# Patient Record
Sex: Female | Born: 1935 | Race: Black or African American | Hispanic: No | Marital: Single | Smoking: Never smoker
Health system: Southern US, Community
[De-identification: ages and names within clinical notes are randomized; demographics above are authoritative.]

## PROBLEM LIST (undated history)

## (undated) DIAGNOSIS — L8990 Pressure ulcer of unspecified site, unspecified stage: Secondary | ICD-10-CM

## (undated) DIAGNOSIS — F329 Major depressive disorder, single episode, unspecified: Secondary | ICD-10-CM

## (undated) DIAGNOSIS — C801 Malignant (primary) neoplasm, unspecified: Secondary | ICD-10-CM

## (undated) DIAGNOSIS — K219 Gastro-esophageal reflux disease without esophagitis: Secondary | ICD-10-CM

## (undated) DIAGNOSIS — R5383 Other fatigue: Secondary | ICD-10-CM

## (undated) DIAGNOSIS — M2450 Contracture, unspecified joint: Secondary | ICD-10-CM

## (undated) DIAGNOSIS — M1990 Unspecified osteoarthritis, unspecified site: Secondary | ICD-10-CM

## (undated) DIAGNOSIS — G4700 Insomnia, unspecified: Secondary | ICD-10-CM

## (undated) DIAGNOSIS — I2510 Atherosclerotic heart disease of native coronary artery without angina pectoris: Secondary | ICD-10-CM

## (undated) DIAGNOSIS — G20A1 Parkinson's disease without dyskinesia, without mention of fluctuations: Secondary | ICD-10-CM

## (undated) DIAGNOSIS — I10 Essential (primary) hypertension: Secondary | ICD-10-CM

## (undated) DIAGNOSIS — I2699 Other pulmonary embolism without acute cor pulmonale: Secondary | ICD-10-CM

## (undated) DIAGNOSIS — G20 Parkinson's disease: Secondary | ICD-10-CM

## (undated) DIAGNOSIS — R531 Weakness: Secondary | ICD-10-CM

## (undated) DIAGNOSIS — Z931 Gastrostomy status: Secondary | ICD-10-CM

## (undated) DIAGNOSIS — R630 Anorexia: Secondary | ICD-10-CM

## (undated) DIAGNOSIS — R5381 Other malaise: Secondary | ICD-10-CM

## (undated) DIAGNOSIS — F32A Depression, unspecified: Secondary | ICD-10-CM

## (undated) HISTORY — DX: Atherosclerotic heart disease of native coronary artery without angina pectoris: I25.10

## (undated) HISTORY — PX: CHOLECYSTECTOMY: SHX55

## (undated) HISTORY — PX: CYSTECTOMY: SUR359

## (undated) HISTORY — PX: HERNIA REPAIR: SHX51

## (undated) HISTORY — PX: HEMICOLECTOMY: SHX854

## (undated) HISTORY — PX: ABDOMINAL HYSTERECTOMY: SHX81

## (undated) HISTORY — PX: GASTROSTOMY TUBE PLACEMENT: SHX655

## (undated) HISTORY — PX: MASTECTOMY: SHX3

---

## 1998-12-13 ENCOUNTER — Inpatient Hospital Stay (HOSPITAL_COMMUNITY): Admission: EM | Admit: 1998-12-13 | Discharge: 1998-12-18 | Payer: Self-pay | Admitting: Emergency Medicine

## 1998-12-18 ENCOUNTER — Encounter: Payer: Self-pay | Admitting: Cardiology

## 2002-03-30 ENCOUNTER — Ambulatory Visit (HOSPITAL_COMMUNITY): Admission: RE | Admit: 2002-03-30 | Discharge: 2002-03-30 | Payer: Self-pay | Admitting: Internal Medicine

## 2002-04-10 ENCOUNTER — Emergency Department (HOSPITAL_COMMUNITY): Admission: EM | Admit: 2002-04-10 | Discharge: 2002-04-10 | Payer: Self-pay | Admitting: Emergency Medicine

## 2002-06-14 ENCOUNTER — Ambulatory Visit (HOSPITAL_COMMUNITY): Admission: RE | Admit: 2002-06-14 | Discharge: 2002-06-14 | Payer: Self-pay | Admitting: Internal Medicine

## 2002-06-15 ENCOUNTER — Inpatient Hospital Stay (HOSPITAL_COMMUNITY): Admission: AD | Admit: 2002-06-15 | Discharge: 2002-06-16 | Payer: Self-pay | Admitting: Internal Medicine

## 2004-01-14 ENCOUNTER — Emergency Department (HOSPITAL_COMMUNITY): Admission: EM | Admit: 2004-01-14 | Discharge: 2004-01-14 | Payer: Self-pay | Admitting: Emergency Medicine

## 2004-06-15 ENCOUNTER — Emergency Department (HOSPITAL_COMMUNITY): Admission: EM | Admit: 2004-06-15 | Discharge: 2004-06-16 | Payer: Self-pay | Admitting: Emergency Medicine

## 2004-06-15 ENCOUNTER — Ambulatory Visit: Payer: Self-pay | Admitting: Internal Medicine

## 2004-06-27 ENCOUNTER — Ambulatory Visit: Payer: Self-pay | Admitting: Internal Medicine

## 2004-06-28 ENCOUNTER — Ambulatory Visit: Payer: Self-pay

## 2004-07-11 ENCOUNTER — Ambulatory Visit: Payer: Self-pay | Admitting: Internal Medicine

## 2004-08-26 ENCOUNTER — Ambulatory Visit: Payer: Self-pay | Admitting: Internal Medicine

## 2004-10-03 ENCOUNTER — Ambulatory Visit: Payer: Self-pay | Admitting: Internal Medicine

## 2004-11-13 ENCOUNTER — Ambulatory Visit: Payer: Self-pay | Admitting: Internal Medicine

## 2004-11-19 ENCOUNTER — Ambulatory Visit: Payer: Self-pay | Admitting: Cardiology

## 2005-01-06 ENCOUNTER — Ambulatory Visit: Payer: Self-pay | Admitting: Internal Medicine

## 2005-02-17 ENCOUNTER — Ambulatory Visit: Payer: Self-pay | Admitting: Internal Medicine

## 2005-03-19 ENCOUNTER — Ambulatory Visit: Payer: Self-pay | Admitting: Internal Medicine

## 2005-04-21 ENCOUNTER — Ambulatory Visit: Payer: Self-pay | Admitting: Cardiology

## 2005-04-23 ENCOUNTER — Ambulatory Visit: Payer: Self-pay | Admitting: Internal Medicine

## 2005-05-22 ENCOUNTER — Ambulatory Visit: Payer: Self-pay | Admitting: Internal Medicine

## 2005-06-13 ENCOUNTER — Ambulatory Visit: Payer: Self-pay | Admitting: *Deleted

## 2005-07-15 ENCOUNTER — Ambulatory Visit: Payer: Self-pay | Admitting: Internal Medicine

## 2005-08-19 ENCOUNTER — Ambulatory Visit: Payer: Self-pay | Admitting: Internal Medicine

## 2005-09-19 ENCOUNTER — Ambulatory Visit: Payer: Self-pay | Admitting: Internal Medicine

## 2005-10-09 ENCOUNTER — Ambulatory Visit: Payer: Self-pay | Admitting: Cardiology

## 2005-11-17 ENCOUNTER — Ambulatory Visit: Payer: Self-pay | Admitting: Internal Medicine

## 2006-01-08 ENCOUNTER — Ambulatory Visit: Payer: Self-pay | Admitting: Internal Medicine

## 2006-02-12 ENCOUNTER — Ambulatory Visit: Payer: Self-pay | Admitting: Cardiology

## 2006-03-26 ENCOUNTER — Ambulatory Visit: Payer: Self-pay | Admitting: Internal Medicine

## 2006-04-24 ENCOUNTER — Ambulatory Visit: Payer: Self-pay | Admitting: Internal Medicine

## 2006-05-22 ENCOUNTER — Ambulatory Visit: Payer: Self-pay | Admitting: Internal Medicine

## 2006-06-21 ENCOUNTER — Ambulatory Visit: Payer: Self-pay | Admitting: Internal Medicine

## 2006-07-06 ENCOUNTER — Ambulatory Visit: Payer: Self-pay | Admitting: Cardiology

## 2006-07-15 ENCOUNTER — Ambulatory Visit: Payer: Self-pay | Admitting: Internal Medicine

## 2006-07-15 ENCOUNTER — Ambulatory Visit (HOSPITAL_COMMUNITY): Admission: RE | Admit: 2006-07-15 | Discharge: 2006-07-15 | Payer: Self-pay | Admitting: Internal Medicine

## 2006-08-03 ENCOUNTER — Ambulatory Visit: Payer: Self-pay

## 2006-08-17 ENCOUNTER — Ambulatory Visit: Payer: Self-pay

## 2006-08-17 ENCOUNTER — Encounter: Payer: Self-pay | Admitting: Cardiology

## 2006-08-17 ENCOUNTER — Ambulatory Visit: Payer: Self-pay | Admitting: Internal Medicine

## 2006-09-11 ENCOUNTER — Ambulatory Visit: Payer: Self-pay | Admitting: Internal Medicine

## 2006-10-09 ENCOUNTER — Ambulatory Visit: Payer: Self-pay | Admitting: Internal Medicine

## 2006-11-06 ENCOUNTER — Ambulatory Visit: Payer: Self-pay | Admitting: Internal Medicine

## 2006-12-11 ENCOUNTER — Ambulatory Visit: Payer: Self-pay | Admitting: Internal Medicine

## 2007-01-01 ENCOUNTER — Ambulatory Visit: Payer: Self-pay | Admitting: Internal Medicine

## 2007-01-29 ENCOUNTER — Ambulatory Visit: Payer: Self-pay | Admitting: Internal Medicine

## 2007-02-26 ENCOUNTER — Ambulatory Visit: Payer: Self-pay | Admitting: Internal Medicine

## 2007-03-29 ENCOUNTER — Ambulatory Visit: Payer: Self-pay | Admitting: Internal Medicine

## 2007-04-23 ENCOUNTER — Ambulatory Visit: Payer: Self-pay | Admitting: Internal Medicine

## 2007-05-04 ENCOUNTER — Ambulatory Visit: Payer: Self-pay | Admitting: Internal Medicine

## 2007-05-21 ENCOUNTER — Ambulatory Visit: Payer: Self-pay | Admitting: Internal Medicine

## 2007-06-18 ENCOUNTER — Ambulatory Visit: Payer: Self-pay | Admitting: Internal Medicine

## 2007-07-20 ENCOUNTER — Ambulatory Visit: Payer: Self-pay | Admitting: Internal Medicine

## 2007-11-12 ENCOUNTER — Ambulatory Visit: Payer: Self-pay | Admitting: Internal Medicine

## 2007-12-29 ENCOUNTER — Ambulatory Visit: Payer: Self-pay

## 2008-02-11 ENCOUNTER — Ambulatory Visit: Payer: Self-pay | Admitting: Internal Medicine

## 2008-05-12 ENCOUNTER — Ambulatory Visit: Payer: Self-pay | Admitting: Internal Medicine

## 2008-05-25 ENCOUNTER — Ambulatory Visit: Payer: Self-pay | Admitting: Cardiology

## 2008-05-25 ENCOUNTER — Ambulatory Visit (HOSPITAL_COMMUNITY): Admission: RE | Admit: 2008-05-25 | Discharge: 2008-05-25 | Payer: Self-pay | Admitting: Internal Medicine

## 2008-05-25 ENCOUNTER — Encounter: Payer: Self-pay | Admitting: Internal Medicine

## 2008-08-11 ENCOUNTER — Ambulatory Visit: Payer: Self-pay | Admitting: Internal Medicine

## 2008-08-16 ENCOUNTER — Ambulatory Visit: Payer: Self-pay | Admitting: Internal Medicine

## 2008-08-17 ENCOUNTER — Encounter: Payer: Self-pay | Admitting: Internal Medicine

## 2009-02-05 DIAGNOSIS — R079 Chest pain, unspecified: Secondary | ICD-10-CM

## 2009-02-05 DIAGNOSIS — I2510 Atherosclerotic heart disease of native coronary artery without angina pectoris: Secondary | ICD-10-CM

## 2009-05-14 ENCOUNTER — Ambulatory Visit: Payer: Self-pay | Admitting: Internal Medicine

## 2009-05-17 ENCOUNTER — Encounter (INDEPENDENT_AMBULATORY_CARE_PROVIDER_SITE_OTHER): Payer: Self-pay | Admitting: *Deleted

## 2009-10-31 ENCOUNTER — Ambulatory Visit: Payer: Self-pay | Admitting: Internal Medicine

## 2009-11-28 ENCOUNTER — Encounter (INDEPENDENT_AMBULATORY_CARE_PROVIDER_SITE_OTHER): Payer: Self-pay | Admitting: *Deleted

## 2009-12-19 ENCOUNTER — Encounter: Payer: Self-pay | Admitting: Internal Medicine

## 2009-12-19 ENCOUNTER — Ambulatory Visit: Payer: Self-pay | Admitting: Cardiology

## 2010-06-28 ENCOUNTER — Encounter (INDEPENDENT_AMBULATORY_CARE_PROVIDER_SITE_OTHER): Payer: Self-pay | Admitting: *Deleted

## 2010-08-07 ENCOUNTER — Encounter (INDEPENDENT_AMBULATORY_CARE_PROVIDER_SITE_OTHER): Payer: Self-pay | Admitting: *Deleted

## 2010-08-16 ENCOUNTER — Encounter: Payer: Self-pay | Admitting: Internal Medicine

## 2010-08-20 NOTE — Letter (Signed)
Summary: Appointment - Reminder 2  [facility]. [address]   [place], [place] [address]   Phone: [phone]  Fax: [phone]     June 28, 2010 MRN: [id number]   Sandy Schmitt [address] [place], [place]  [address]   Dear [name],  Our records indicate that it is time to schedule a follow-up appointment.  Dr.  Ladona Ridgel        recommended that you follow up with [place] in    12.2011        . It is very important that we reach you to schedule this appointment. We look forward to participating in your health care needs. Please contact us at the number listed above at your earliest convenience to schedule your appointment.  If you are unable to make an appointment at this time, give [place] a call so we can update our records.     Sincerely,   Glass blower/designer

## 2010-08-20 NOTE — Letter (Signed)
Summary: Appointment - Reminder 2  [facility] [place]. [address] [place] [address]   Phone: [phone]  Fax: [phone]     Nov 28, 2009 MRN: [id number]   Sandy Schmitt [address] [place], [place]  [address]   Dear [name],  Our records indicate that it is time to schedule a follow-up appointment.  Dr.   Ladona Ridgel       recommended that you follow up with [place] in     JANUARY 2011       . It is very important that we reach you to schedule this appointment. We look forward to participating in your health care needs. Please contact us at the number listed above at your earliest convenience to schedule your appointment.  If you are unable to make an appointment at this time, give [place] a call so we can update our records.     Sincerely,   [employer] Scheduling Team [phone]

## 2010-08-20 NOTE — Procedures (Signed)
Summary: PAST DUE FOR PACER CHECK PER PT PHONE CALL/TG   Current Medications (verified): 1)  Effexor Xr 150 Mg Xr24h-Cap (Venlafaxine Hcl) .... One By Mouth Daily 2)  Allegra 180 Mg Tabs (Fexofenadine Hcl) .... One By Mouth Daily 3)  Lipitor 10 Mg Tabs (Atorvastatin Calcium) .... One By Mouth Daily 4)  Lasix 40 Mg Tabs (Furosemide) .... One By Mouth Daily 5)  Aspir-Low 81 Mg Tbec (Aspirin) .... One By Mouth Daily 6)  Klor-Con 10 10 Meq Cr-Tabs (Potassium Chloride) .... One By Mouth Daily 7)  Cyproheptadine Hcl 4 Mg Tabs (Cyproheptadine Hcl) .... One By Mouth Daily 8)  Metoprolol Tartrate 25 Mg Tabs (Metoprolol Tartrate) .... One By Mouth Daily 9)  Omeprazole 20 Mg Cpdr (Omeprazole) .... One By Mouth Daily 10)  Calcium 600 Mg Tabs (Calcium) .... One By Mouth Daily 11)  Daily Multi  Tabs (Multiple Vitamins-Minerals) .... One By Mouth Daily 12)  Vita-Plus E 400 Unit Caps (Vitamin E) .... One By Mouth Daily 13)  Vitamin D3 2000 Unit Caps (Cholecalciferol) .... One By Mouth Daily 14)  Vitamin C 500 Mg Tabs (Ascorbic Acid) .... One By Mouth Daily  Allergies (verified): 1)  ! Penicillin 2)  ! Jonne Ply   PPM Specifications Following MD:  Lewayne Bunting, MD     PPM Vendor:  [facility] [employer]     PPM Model Number:  [id number]     PPM Serial Number:  [id number] PPM DOI:  07/15/2006     PPM Implanting MD:  Lewayne Bunting, MD  Lead 1    Location: RA     DOI: 12/17/1998     Model #: [id number]     Serial #: [id number]     Status: active Lead 2    Location: RV     DOI: 12/17/1998     Model #: [id number]     Serial #: [id number]     Status: active   Indications:  SICK SINUS SYNDROME   PPM Follow Up Remote Check?  No Battery Voltage:  good V     Battery Est. Longevity:  >5 years     Pacer Dependent:  No       PPM Device Measurements Atrium  Amplitude: 2.6 mV, Impedance: 320 ohms, Threshold: 0.3 V at 0.4 msec Right Ventricle  Amplitude: 9.0 mV, Impedance: 440 ohms, Threshold: 0.8 V at 0.4 msec  Episodes MS Episodes:  0      Percent Mode Switch:  0     Coumadin:  No Ventricular High Rate:  4     Atrial Pacing:  42%     Ventricular Pacing:  91%  Parameters Mode:  DDD     Lower Rate Limit:  60     Upper Rate Limit:  115 Paced AV Delay:  300     Sensed AV Delay:  210 Next Cardiology Appt Due:  06/20/2010 Tech Comments:  No parameter changes.  Device function normal.  4 VHR episodes probable VT, the patient was asymptomatic.  TTM's with [employer].  ROV 6 months in RDS with Dr. Ladona Ridgel. Altha Harm, LPN  December 19, 6293 9:02 AM

## 2010-08-20 NOTE — Cardiovascular Report (Signed)
Summary: TTM   TTM   Imported By: Roderic Ovens 12/13/2009 11:45:50  _____________________________________________________________________  External Attachment:    Type:   Image     Comment:   External Document

## 2010-08-20 NOTE — Cardiovascular Report (Signed)
Summary: Office Visit   Office Visit   Imported By: Roderic Ovens 01/12/2010 12:06:28  _____________________________________________________________________  External Attachment:    Type:   Image     Comment:   External Document

## 2010-08-22 NOTE — Letter (Signed)
Summary: Device-Delinquent Check  [facility], Main Office  [address]   [place], [place] [address]   Phone: [phone]  Fax: [phone]     August 07, 2010 MRN: [id number]   Sandy Schmitt [address] [place], [place]  [address]   Dear Ms. Mccauslin,  According to our records, you have not had your implanted device checked in the recommended period of time.  We are unable to determine appropriate device function without checking your device on a regular basis.  Please call our office to schedule an appointment, with Dr Ladona Ridgel,  as soon as possible.  If you are having your device checked by another physician, please call us so that we may update our records.  Thank you,  Letta Moynahan, EMT  August 07, 2010 11:45 AM  [facility]  certified

## 2010-09-11 NOTE — Cardiovascular Report (Signed)
Summary: Certified Letter Signed - (not doing f/u)  Certified Letter Signed - (not doing f/u)   Imported By: Debby Freiberg 09/02/2010 11:55:51  _____________________________________________________________________  External Attachment:    Type:   Image     Comment:   External Document

## 2010-12-03 NOTE — Assessment & Plan Note (Signed)
 HEALTHCARE                         ELECTROPHYSIOLOGY OFFICE NOTE   SINCERITY, CEDAR                       MRN:          [id number]  DATE:05/04/2007                            DOB:          24-Jun-1917    Ms. Storlie returns today for followup.  She is a very pleasant 77-year-  old woman with a history of intermittent complete heart block as well as  ventricular tachycardia who is status post ICD insertion initially back  in 2000 with generator change in December of last year.  She returns  today for followup.  She denies syncope.  She denies much in the way of  palpitations or syncope.  She has been otherwise stable.  She does note  that her appetite has gone down some in the last few weeks.  She denies  peripheral edema, she denies difficulty with sleep, she denies problems  with her bowels.   MEDICATIONS:  1. Lipitor 10 mg a day.  2. Toprol-XL 25 a day.  3. Enteric coated aspirin 81 a day.  4. Furosemide 80 mg 1/2 tablet daily.  5. Potassium supplement.  6. Ranitidine 150 mg daily.   PHYSICAL EXAM:  Notable for her being a pleasant but elderly appearing  woman in no acute distress.  She is able to answer all questions very  well.  Her blood pressure was 117/75, the pulse was 78 and regular, the  respirations were 16, the weight was not recorded secondary to her being  in a wheelchair.  NECK:  No jugular venous distention.  LUNGS:  Clear bilaterally to auscultation.  No wheezes, rales or rhonchi  were present.  CARDIOVASCULAR EXAM:  A regular rate and rhythm with a normal S1 S2.  EXTREMITIES:  No edema.   Interrogation of her pacemaker demonstrates a Guidant Insignia with P  and R waves of 2 and 10 respectively, the __________ impedance was 340  in the atrium, 420 in the ventricle, 0.6 at 0.4 was the atrial threshold  and 0.8 at 0.4 was the RV threshold.  There were no mode switching  episodes.  She did have 39 high rate episodes in the  ventricle, this was  nonsustained VT.   IMPRESSION:  1. Symptomatic bradycardia status post pacemaker insertion.  2. Nonsustained ventricular tachycardia.  3. Obesity.   DISCUSSION:  Overall, Ms. Milius is stable, her pacemaker is working  normally, her heart failure is well controlled.  She will see [place] back in  the office in 1 year, with me in 6 months in our device clinic.     Doylene Canning. Ladona Ridgel, MD  Electronically Signed    GWT/MedQ  DD: 05/04/2007  DT: 05/04/2007  Job #: [id number]   cc:   Doreen Beam

## 2010-12-03 NOTE — Assessment & Plan Note (Signed)
[facility]                         ELECTROPHYSIOLOGY OFFICE NOTE   CHANNELL, QUATTRONE                       MRN:          [id number]  DATE:08/16/2008                            DOB:          12/26/1916    Sandy Schmitt returns today for followup.  She is a very pleasant elderly  woman with a history of symptomatic bradycardia and hypertension, who  underwent permanent pacemaker insertion back in 2007.  She returns today  for followup.  She denies chest pain or shortness of breath.  She does  have some peripheral edema, but she admits to some sodium indiscretion.   MEDICATIONS:  1. Lipitor 10 a day.  2. Toprol-XL 25 a day.  3. Multivitamin.  4. Aspirin 81 a day.  5. Furosemide 80 mg half-tablet daily.  6. Effexor 150 a day.  7. Potassium 20 two tablets daily.  8. Ranitidine 150 daily.   PHYSICAL EXAMINATION:  GENERAL:  She is a pleasant, elderly-appearing  woman in no distress.  VITAL SIGNS:  Blood pressure was 132/83, pulse was 69 and regular,  respirations were 18.  The weight was not recorded secondary to her  being in a wheelchair.  NECK:  No jugular venous distention.  LUNGS:  Clear bilaterally to auscultation.  No wheezes, rales, or  rhonchi are present.  There is no increased work of breathing.  CARDIOVASCULAR:  Regular rate and rhythm.  Normal S1 and S2.  EXTREMITIES:  Trace peripheral edema bilaterally.   Interrogation of her pacemaker demonstrates a [employer].  The P  and R-waves were 2 and 9 respectively.  The impedance was 300 in the A  and 410 in the V.  The threshold was 0.8 at 0.4 in the A and the RV.  The battery voltage was still at beginning of the life.  Estimated  longevity was greater than 5 years.  She was 99% V-paced and 1% A-paced.   IMPRESSION:  1. Symptomatic bradycardia.  2. Hypertension.  3. Status post pacemaker insertion.   DISCUSSION:  Ms. Langenberg is stable.  Her pacemaker is working normally.  Her  blood pressure is well controlled.  We talked about the importance  of maintaining on a low-sodium diet.  I will plan to see the patient  back in the office for followup in 1 year.     Doylene Canning. Ladona Ridgel, MD  Electronically Signed    GWT/MedQ  DD: 08/16/2008  DT: 08/17/2008  Job #: [id number]

## 2010-12-06 NOTE — H&P (Signed)
NAME:  Sandy Schmitt, Sandy Schmitt                          ACCOUNT NO.:  [id number]   MEDICAL RECORD NO.:  [id number]                   PATIENT TYPE:  INP   LOCATION:  A330                                 FACILITY:  [facility]   PHYSICIAN:  Lionel December, M.D.                 DATE OF BIRTH:  07-21-1917   DATE OF ADMISSION:  06/15/2002  DATE OF DISCHARGE:                                HISTORY & PHYSICAL   CHIEF COMPLAINT:  Inability to keep liquids, solids or pills down.   HISTORY OF PRESENT ILLNESS:  The patient is an 77 year old, African-American  female with multiple medical problems who had esophagogastroduodenoscopy  with esophageal dilatation by me yesterday.  She presented with frequent  dysphagia and she also has history of abdominal pain, nausea and vomiting.  She was noted to have a sliding hiatal hernia and gastritis.  Her esophagus  was dilated by passing a 56 [place] dilator.  Postprocedure, she was served  lunch and she had no difficulty swallowing soft foods.  The patient's  caretaker called this morning stating that she was not able to keep liquids,  food or pills down.  I talked with the daughter and suggested giving her  liquids.  This afternoon she called in stating that her mother was still  unable to keep any food down.  Given her multiple medical problems and the  fact that she needs to be on medication, it was decided to bring her to the  hospital for IV fluids and further management.  She denies chest pain,  odynophagia, hoarseness, chronic cough or dyspnea.  She also denies  abdominal pain, melena or rectal bleeding.  She did have a BM yesterday.  She has chronic constipation and has been on therapy.  She denies headache,  fever or chills.  She states that she did not have any problems swallowing  yesterday.  She has a history of Schatzki's ring.  She had her esophagus  dilated initially on March 30, 2002, and had significant improvement  post dilatation, but this lasted  only for eight to nine weeks.  At that  time, her H. pylori serology was positive and she was treated with a one-  week course of Biaxin, Flagyl and Aciphex.  She also had intermittent  abdominal pain.  She had a small bowel series recently at [facility] which was  within normal limits.  She denies weakness.   MEDICATIONS:  1. MiraLax 17 g q.d.  2. Nitroglycerin spray 0.4 mg p.r.n.  3. Tessalon Perles 100 mg t.i.d. p.r.n.  4. Prozac 40 mg q.d.  5. Isosorbide 15 mg q.d.  6. Lasix 40 mg q.d.  7. Klor-Con 20 mEq q.d.  8. Reminyl 4 mg b.i.d.  9. Aciphex 20 mg q.d.   REVIEW OF SYMPTOMS:  Negative for headache, hematuria or dysuria, visual or  oral symptoms.   PAST MEDICAL HISTORY:  1. Depression/anxiety.  2. Dementia.  3. History of CHF.  4. Chronic GERD.  5. Pacemaker placement about three years ago for arrhythmia.  6. Cholecystectomy.  7. Hysterectomy.  8. Repair of incisional hernia following cholecystectomy.  9. Ganglion cyst removed from her left hand.  10.      Chronic constipation.  11.      Right mastectomy 22 years ago for breast CA and has remained in     remission.  12.      In 1998, she had partial colectomy for colon carcinoma and did not     require any therapy.  Her last colonoscopy was in January 2003, which was     unremarkable.  Her abdominal pain has been evaluated with abdominal CT     six months ago which is unremarkable.   ALLERGIES:  PENICILLIN and ENTERIC COATED ASPIRIN.   FAMILY HISTORY:  Positive for breast CA in all of her sisters.  Negative for  colorectal carcinoma.   SOCIAL HISTORY:  She is retired.  She does not smoke cigarettes or drink  alcohol.  She has one daughter who lives with her and looks after her.  She  also has help everyday.   PHYSICAL EXAMINATION:  GENERAL:  Elderly, mildly obese, African-American  female who does not appear to be in any distress.  Weight 177.2 pounds,  height 5 feet 1 inch tall.  Her weight on May 20, 2002, was  185 pounds.  VITAL SIGNS:  Pulse 69 per minute, blood pressure 110/55, respirations 20,  temperature 98.3.  HEENT:  Conjunctivae pink and sclerae nonicteric.  Oropharyngeal mucosa is  normal.  She has upper and lower dentures in place.  NECK:  Supple.  JVD is less than 5 cm.  No lymphadenopathy or thyromegaly.  CARDIAC:  Regular rate and rhythm with S1, S2.  No murmur, rub or gallop.  Pacemaker is located in the left pectoral area.  LUNGS:  Clear to auscultation.  ABDOMEN:  Protuberant.  Bowel sounds normal.  Palpitation reveals soft  abdomen without tenderness, organomegaly or masses.  RECTAL:  Deferred.  EXTREMITIES:  No clubbing or peripheral edema.   ASSESSMENT:  The patient is an elderly female with multiple medical problems  who has been experiencing difficulty swallowing liquids, solids or  medications this morning.  She underwent esophagogastroduodenoscopy with  esophageal dilatation yesterday with mucosal destruction in her cervical  esophagus; however, she had no difficulty swallowing immediately post  dilatation.  She does not have any chest pain, dyspnea or fever.  I suspect  that this may be due to an esophageal spasm associated with mucosal injury.  I feel that this should resolve with supportive therapy.  While waiting for  this to occur, we will have to support her.   PLAN:  1. We will give her IV fluids.  2. Check CBC and BMET to make sure she does not have any electrolyte     imbalance.  Also, make sure serum calcium is normal.  3.     Give her a trial with clear liquids.  4. Will continue her on most of her medications with Protonix 40 mg IV x1     and then after p.o.  5. If she does not improve by next morning, we will consider barium swallow     and chest x-ray.  Lionel December, M.D.    NR/MEDQ  D:  06/15/2002  T:  06/16/2002  Job:  [id number]  cc:   Doreen Beam  [address]  [place]  [place] [address]  Fax:  [phone]

## 2010-12-06 NOTE — Op Note (Signed)
NAME:  Sandy Schmitt, Sandy Schmitt NO.:  [id number]   MEDICAL RECORD NO.:  [id number]          PATIENT TYPE:  OIB   LOCATION:  2853                         FACILITY:  [facility]   PHYSICIAN:  Duke Salvia, MD, FACCDATE OF BIRTH:  1917/06/24   DATE OF PROCEDURE:  07/15/2006  DATE OF DISCHARGE:                               OPERATIVE REPORT   PREOPERATIVE DIAGNOSIS:  Previously implanted pacemaker for tachy-brady  syndrome, with pacemaker implanted, now at [facility].   POSTOPERATIVE DIAGNOSIS:  Previously implanted pacemaker for tachy-brady  syndrome, with pacemaker implanted, now at [facility].   PROCEDURE:  Explantation of a previously implanted device and  implantation of new device.   SURGEON:  Duke Salvia, MD, [facility].   DESCRIPTION OF PROCEDURE:  Following the obtaining of informed consent,  the patient was brought to the electrophysiology laboratory and placed  on the fluoroscopic table in the supine position.  After routine prep  and drape of the left upper chest, lidocaine was infiltrated along the  line of the previous incision and carried down to the layer of the  device pocket using sharp dissection and electrocautery.  The pocket was  opened.  The device was explanted.  There was some difficulty getting  the leads out, but we were able to wiggle them out without damage.  The  previously implanted atrial lead was a [id number], serial number, and  the ventricular lead serial # was [id number].  The previously implanted  atrial lead demonstrated a P wave of 4.4, an impedance of 402, a  threshold of 0.6 at 0.5.  The R wave was 13.4, the impedance 469, and  threshold of 0.6 at 0.5.  With these acceptable perimeters recorded, the  leads were then attached to a Guidant Insignia 1297 pulse generator,  serial [id number].  AV pacing was identified.  The pocket was copiously  irrigated with antibiotic-containing saline solution.  Hemostasis was  assured and the leads and the pulse generator  were placed in the pocket  and secured to the prepectoral fascia.  The wound was closed in 3 layers  in the normal fashion.  The wound was washed and dried, and a benzoin  and Steri-Strips dressing was applied.  Needle counts, sponge counts and  instrument counts were correct at the end of the procedure according to  the staff.  The patient tolerated the procedure without apparent  complication.      Duke Salvia, MD, [facility]  Electronically Signed     SCK/MEDQ  D:  07/15/2006  T:  07/15/2006  Job:  [id number]   cc:   Dr Missy Sabins  Electrophysiology Laboratory

## 2010-12-06 NOTE — Procedures (Signed)
   NAME:  Sandy Schmitt, Sandy Schmitt                          ACCOUNT NO.:  [id number]   MEDICAL RECORD NO.:  [id number]                   PATIENT TYPE:  EMS   LOCATION:  ED                                   FACILITY:  [facility]   PHYSICIAN:  Edward L. Juanetta Gosling, M.D.             DATE OF BIRTH:  [birth year]   DATE OF PROCEDURE:  04/10/2002  DATE OF DISCHARGE:  04/10/2002                                EKG INTERPRETATION   The rhythm is a sinus rhythm with PAC's.  There is bundle branch block.  Q  waves were seen in multiple areas, which is probably secondary to bundle  branch block.  Abnormal electrocardiogram.                                               Oneal Deputy. Juanetta Gosling, M.D.    ELH/MEDQ  D:  05/12/2002  T:  05/13/2002  Job:  [id number]

## 2010-12-06 NOTE — Discharge Summary (Signed)
NAME:  Sandy Schmitt, Sandy Schmitt                          ACCOUNT NO.:  [id number]   MEDICAL RECORD NO.:  [id number]                   PATIENT TYPE:  INP   LOCATION:  A330                                 FACILITY:  [facility]   PHYSICIAN:  Lionel December, M.D.                 DATE OF BIRTH:  1917-07-03   DATE OF ADMISSION:  06/15/2002  DATE OF DISCHARGE:  06/16/2002                                 DISCHARGE SUMMARY   PRINCIPAL DIAGNOSES:  1. Nausea/vomiting, resolved.  2. Chronic reflux esophagitis.  3. Possible esophageal motility disorder.  4. Dementia.  5. History of congestive heart failure.  6. The patient has a permanent pacemaker.   CONDITION AT TIME OF DISCHARGE:  Much improved.   DISCHARGE MEDICATIONS:  1. Aciphex 20 mg q.a.m.  2. Reminyl 4 mg b.i.d.  3. Klor-Con 20 mEq once daily.  4. Lasix 40 mg q.a.m.  5. Isosorbide 15 mg once daily.  6. Prozac 40 mg once daily.  7. Nitroglycerin spray 0.4 mg p.r.n.  8. MiraLax 17 g p.o. once daily.   HOSPITAL COURSE:  The patient is an 77 year old African-American female with  multiple medical problems who underwent EGD with ED for dysphagia on  June 14, 2002.  EGD findings were a small sliding hiatal hernia and  gastritis.  There was no obvious ring or stricture.  Her esophagus was  dilated by passing a 56-[place] dilator through her esophagus.  The  patient actually was served lunch following her EGD/ED at the hospital and I  saw her during that process and she had no difficulty swallowing.  The  patient went home.  When staff called the following day to check on her, her  daughter/caretaker stated that she had not been able to keep her ___________  food down.  I called the patient later in the day and found out that she  said was having same problem.  She was therefore brought to the hospital and  admitted.  She was given IV fluids.  Her lab studies were normal except  serum sodium 134 which is borderline.  Her BUN and creatinine  were 7 and  0.9, calcium was normal, her CBC was also normal.  EKG showed pacer at a  rate of 73.  The patient appeared to become stable.  Her exam was  unremarkable.  She had dentures in place.  Oropharyngeal mucosa was normal  and pharyngeal reflexes also intact.  Her abdominal exam was normal.  She  was begun on a soft diet.  She had no trouble swallowing it.  By the next  morning, she was still asymptomatic.  I was not sure why dramatic  improvement of her symptoms but I wondered if  she had a small food bolus getting stuck in her esophagus which passed  spontaneously.  The patient was discharged to home in improved condition.  She will be seen in  office on as-needed basis.  She will resume followup  with her primary care physician Dr. Doreen Beam.                                               Lionel December, M.D.    NR/MEDQ  D:  08/06/2002  T:  08/07/2002  Job:  [id number]   cc:   Doreen Beam  [address]  [place]  [place] [address]  Fax: [phone]

## 2010-12-06 NOTE — H&P (Signed)
NAME:  Sandy Schmitt, Sandy Schmitt NO.:  [id number]   MEDICAL RECORD NO.:  [id number]          PATIENT TYPE:  OIB   LOCATION:  2853                         FACILITY:  [facility]   PHYSICIAN:  Duke Salvia, MD, FACCDATE OF BIRTH:  [birth year]   DATE OF ADMISSION:  07/15/2006  DATE OF DISCHARGE:                              HISTORY & PHYSICAL   SHE HAS ALLERGIES TO PENICILLIN AND ENTERIC-COATED ASPIRIN.   Her primary caregiver is Dr. Doreen Beam.  Dr. Andee Lineman is her  cardiologist.  Dr. Ladona Ridgel and Dr. Graciela Husbands at [facility] at the  [facility] office are her electrophysiologists.   BRIEF HISTORY:  Sandy Schmitt is an 77 year old female with admission in  November 2007 for chest pain.  She has presumed coronary artery disease  with a Myoview study in 2001 which demonstrated ejection fraction of 47%  with a fixed inferior defect and a small area of peri-infarction  ischemia on the November visit with essentially negative troponin 1  studies.  The patient was treated medically.  Toprol XL 25 mg daily was  added to her medication regimen as well as an increase in her Imdur from  30 mg to 60 mg daily.   The patient also has a history of tachy-brady syndrome with implant of a  Guidant Discovery dual-chamber pacemaker implanted the year 2000.  The  patient is not pacemaker dependent.  She had interrogation of the device  on July 06, 2006.  The pacemaker was found to be at elective  replacement indicator.  Arrangements were made for pacemaker generator  change today, December 26.   PAST MEDICAL HISTORY:  1. Coronary artery disease.  No previous catheterization.  Myoview      study 2001, ejection fraction 47% with inferior defect.  2. Tachybrady syndrome status post implant Guidant Discovery dual-      chamber pacemaker in 2000.  3. Dementia.  4. CHF.  5. History of breast cancer status post right mastectomy.  6. History of colon cancer status post right hemicolectomy.  7.  Obesity.  8. Chronic GERD with dysphagia.  9. Status post cholecystectomy/hysterectomy.  10.Chronic constipation.  11.Small hiatal hernia.   SOCIAL HISTORY:  The patient does not smoke or take alcoholic beverages.  She lives in [place] with her daughter.   FAMILY HISTORY:  Is positive for breast cancer in all of her sisters.   MEDICATIONS:  1. Toprol XL 25 mg daily.  2. Imdur 60 mg daily.  3. Lipitor 10 mg daily at bedtime.  4. Aspirin 81 mg daily.  5. K-Dur 10 mEq daily.  6. Ranitidine 150 mg daily.  7. Effexor 150 mg daily.  8. Furosemide 40 mg daily.   PHYSICAL EXAMINATION:  Temperature 97.0, blood pressure 123/67, heart  rate is 67, respirations of 20, oxygen saturation 100% on room air.  Weight is 189.  The patient is awake, and she responds appropriately for  her mental status.  History has not been taken from this patient but  from her daughter and other family members.  LUNGS:  Are clear to auscultation bilaterally.  HEART:  Regular rate and rhythm.  ABDOMEN:  Soft, nondistended.  Bowel sounds are present.  EXTREMITIES:  Show no evidence of appreciable edema.   Note, family members state that the patient has revisited the emergency  room twice since the report dictated in the charts on November 27.  Both  of these visitations were for chest pain.  One time, she stayed  overnight with IV nitroglycerin; the other time, she just was  administered sublingual nitroglycerin and then went home.  Family also  states the patient has had increasing trouble eating and pill  swallowing.  The patient had esophageal dilatation in 2003.   IMPRESSION:  Pacemaker at elective replacement indicator.   PLAN:  Pacemaker generator change today, Dr. Sherryl Manges.   LABORATORY STUDIES:  Pertinent to this admission were obtained on  July 07, 2006.  Glucose was 140, BUN is 18, creatinine is 1.0;  sodium 135, potassium 3.4, chloride is 101, carbonate 27.  Pro time is  12.0, INR is  1.0 and PTT is 23.6.  Complete blood count:  White cells  6.1, hemoglobin 14.3, hematocrit 43, platelets 250.      Maple Mirza, [place]      Duke Salvia, MD, [facility]  Electronically Signed    GM/MEDQ  D:  07/15/2006  T:  07/15/2006  Job:  [id number]

## 2010-12-06 NOTE — Assessment & Plan Note (Signed)
[facility]                         ELECTROPHYSIOLOGY OFFICE NOTE   Sandy Schmitt, Sandy Schmitt                       MRN:          [id number]  DATE:08/03/2006                            DOB:          02/05/1917    Sandy Schmitt was seen today in the clinic, on August 03, 2006, for a  wound check of her new implant.  Guidant model No. Is 1297 Insignia.  Date of implant was July 15, 2006 for sick sinus syndrome.   On interrogation of her device today, her battery voltage was beginning  of life.  P-waves measured 1.6 millivolts  with an atrial capture  threshold of 1 volt at 0.4 milliseconds and an atrial lead impedence of  340 ohms.  R-waves measured 1.9 millivolts  with a ventricular pacing  threshold of 0.8 volts at 0.4 milliseconds and a ventricular lead  impedence of 430 ohms.  There were no changes in her parameters.  Her  leads are chronic, although this device is new.  Her Steri-Strips were  removed.  Her wound was without redness or edema.  It did show 8 high  ventricular episodes and when I looked at the electrograms on this, I  did indeed look like ventricular tachycardia.  I showed this to Dr.  Graciela Husbands and we are scheduling her for an echocardiogram with a return  office visit in 2 weeks to see Dr. Ladona Ridgel.      Altha Harm, LPN  Electronically Signed      Doylene Canning. Ladona Ridgel, MD  Electronically Signed   PO/MedQ  DD: 08/03/2006  DT: 08/03/2006  Job #: [id number]

## 2010-12-06 NOTE — Op Note (Signed)
NAME:  DESTINE, ZIRKLE                          ACCOUNT NO.:  [id number]   MEDICAL RECORD NO.:  [id number]                   PATIENT TYPE:  AMB   LOCATION:  DAY                                  FACILITY:  [facility]   PHYSICIAN:  Lionel December, M.D.                 DATE OF BIRTH:  [birth year]   DATE OF PROCEDURE:  06/14/2002  DATE OF DISCHARGE:                                 OPERATIVE REPORT   PROCEDURE:  Esophagogastroduodenoscopy with esophageal dilatation.   INDICATIONS FOR PROCEDURE:  This is an 77 year old African-American female  with multiple medical problems who has recurring dysphagia.  She had her  esophagus dilated about 10 weeks ago.  She is noted to have distal ring and  esophageal valve.  This has provided relief only for a few weeks.  She is,  therefore, undergoing repeat dilatation.  She has experienced transient pain  while in waiting area.  Pain was relieved with nitroglycerin which she uses  every now and then.  Her vital signs were stable.  Lungs remained clear and  EKG showed a pace rhythm without any acute abnormalities.  I, therefore,  felt it was safe for me to proceed with the procedure.   Informed consent for the procedure was obtained.   PREOPERATIVE MEDICATIONS:  Cetacaine spray for pharyngeal topical  anesthesia.  Demerol 25 mg IV, Versed 2 mg IV in divided dose.   INSTRUMENT USED:  Olympus video system.   FINDINGS:  Procedure performed in endoscopy suite.  The patient's vital  signs and O2 saturation were monitored during the procedure and remained  stable.  The patient was placed in left lateral position and the endoscope  was passed through oral pharynx without any difficulty into the esophagus.   Esophagus:  Mucosa of the esophagus was normal throughout.  No web anatomy  was noted to cervical esophagus, at least nothing was discernable.  Squamocolumnar junction was unremarkable and there was small sliding hiatal  hernia.   Stomach:  It was empty and  distended very well with insufflation.  The folds  of the proximal stomach are normal.  Examination of the mucosa revealed  patchy erythema and granularity at the body and antrum.  Please note that  patient was diagnosed with H. pylori gastritis 10 weeks ago and has received  one week of therapy.  Pyloric channel was patent.  The angularis, fundus,  examined by retroflexing the scope were also normal.   Duodenum:  Exam of the bulb and second part of the duodenum was normal.   Endoscope was withdrawn.   Esophageal dilatation was performed by passing 56 French Maloney dilator  through the esophagus completely.  I was not able to pass it to full  insertion but I was able to pass it to 45 cm.  After the dilator was  withdrawn, the endoscope was passed again and esophagus re-examined.  There  was  a boat-shaped tear at cervical esophagus employing either a tubular  anatomy or a web.  There was no tear noted at the distal esophagus.  The  endoscope was withdrawn.  The patient tolerated the procedure well.   FINAL DIAGNOSES:  1. No obvious abnormality noted to the esophagus.  However, on passing 56     French Maloney dilator, there was a mucosal tear at cervical esophagus     indicating either a web or a subtle tubular stricture.  2. Small sliding hiatal hernia.  3. Persistent changes of gastritis.  This may indicate lack of response to     H. pylori gastritis.   RECOMMENDATIONS:  She will continue antireflux measures and PPI and other  medications as before.  If she remains with dysphagia, will consider barium  study with a pill prior to esophageal manometry.                                               Lionel December, M.D.    NR/MEDQ  D:  06/14/2002  T:  06/14/2002  Job:  [id number]

## 2010-12-06 NOTE — Discharge Summary (Signed)
NAME:  Sandy Schmitt, TOPPINS NO.:  [id number]   MEDICAL RECORD NO.:  [id number]          PATIENT TYPE:  OIB   LOCATION:  2853                         FACILITY:  [facility]   PHYSICIAN:  Duke Salvia, MD, FACCDATE OF BIRTH:  Apr 05, 1917   DATE OF ADMISSION:  07/15/2006  DATE OF DISCHARGE:  07/15/2006                               DISCHARGE SUMMARY   ALLERGIES:  TO PENICILLIN AND ENTERIC-COATED ASPIRIN.   PRINCIPAL DIAGNOSES:  1. Dual-chamber pacemaker implanted in 2000, a Guidant Discovery model      was at elective replacement indicator.  2. Patient discharging day of implant of a Guidant Insignia I + DR      dual-chamber pacemaker Dr. Sherryl Manges.  The patient had no      postprocedural complications.  No hematoma was noted.  The patient      is not having discomfort which cannot be controlled with oral      analgesics.  3. The patient's family reports that this patient is having increasing      trouble swallowing pills and eating meals.  A possible GI reconsult      might be considered.   SECONDARY DIAGNOSES:  1. History of tachy-brady syndrome status post implant of Guidant      Discovery pacemaker in 2000.  2. Coronary artery disease.  No previous catheterization study.      Myoview in 2001, ejection fraction 47% with inferior defect.  3. Dementia.  4. Congestive heart failure.  5. History of breast cancer status post right mastectomy.  6. History of colon cancer status post right hemicolectomy.  7. Obesity.  8. Chronic gastroesophageal reflux disease.  9. Status post cholecystectomy/hysterectomy.  10.Chronic constipation.  11.Small hiatal hernia.  12.History of esophageal stricture status post dilatation 2003.   PROCEDURE:  July 15, 2006, explantation of existing Guidant  Discovery dual-chamber pacemaker with implantation of Guidant Insignia  dual-chamber pacemaker, Dr. Sherryl Manges.  There were no postprocedural  complications.   BRIEF HISTORY:  Ms.  Sandy Schmitt is an 77 year old female with admissions in  the past for chest pain.  Her most recent documented admission was  June 16, 2006, and then she was treated medically.  Her the troponin  1 studies were essentially negative.  Toprol was added to her medical  regimen, and her Imdur was increased to 60 mg daily from 30 mg daily.  The patient's family tells me that she has been admitted to the  emergency room twice since that date, once for an overnight stay with IV  nitroglycerin and once just for a visit with quick release after  administering sublingual nitroglycerin.   The patient has history of tachy-brady syndrome, and recently presented  to the [facility] pacer clinic on July 06, 2006.  The  pacemaker was interrogated, and the study showed that it was at elective  replacement indicator.  She presents electively on December 26 for  generator change.   HOSPITAL COURSE:  The patient presents to [facility] December  26.  She underwent explantation of her existing device with implantation  of  a [employer] device.  No complications were noted.  The patient  discharges with the instruction to keep her incision dry for the next 7  days and to sponge bathe until Wednesday January 7.  She is asked not to  lift anything greater than 10 pounds for next 2 weeks.   DISCHARGE MEDICATIONS:  1. Toprol XL 25 mg daily.  2. Imdur 60 mg daily.  3. Lipitor 10 mg daily at bedtime.  4. Aspirin 81 mg daily.  5. K-Dur 10 mEq daily.  6. Ranitidine 150 mg daily.  7. Effexor XR 150 mg daily.  8. Furosemide 40 mg daily.  9. Nitroglycerin 0.4 mg sublingually every 5 minutes x3 doses as      needed for chest pain.   She has follow-up at the pacer clinic at [facility], [address] in [place], Monday January 14 at 9:40 in the morning.   LABORATORY STUDIES:  Pertinent to this admission were drawn July 07, 2006.  Glucose of 140, BUN is 18,  creatinine 1, sodium 135, potassium  3.4, chloride 101, carbonate 27.  The protime is 12, INR 1.0, PTT is  23.6.  White cells 6.1, hemoglobin 14.3, hematocrit 43.0, platelets are  250.      Sandy Schmitt, [place]      Duke Salvia, MD, [facility]  Electronically Signed    GM/MEDQ  D:  07/15/2006  T:  07/15/2006  Job:  [id number]   cc:   Doylene Canning. Ladona Ridgel, MD  Duke Salvia, MD, [facility]  Learta Codding, MD,FACC  Dhruv Sherril Croon

## 2010-12-06 NOTE — Cardiovascular Report (Signed)
[facility]                     [place] ELECTROPHYSIOLOGY DEVICE CLINIC NOTE   Sandy Schmitt, EIDE                       MRN:          [id number]  DATE:02/12/2006                            DOB:          12/25/1922    [name] is seen in device clinic today in the [place] office on February 12, 2006, for follow-up of her Guidant Discovery model 1274 dual-chamber  pacemaker implanted May 2000 for sick sinus syndrome.  Upon interrogation  her battery is nearing ERI.  Her daughter was made aware of this and I  stressed the importance of monthly transtelephonic checks.  In the atrium,  intrinsic amplitude is 1.1 mV with the impedance of 370 Ohms and threshold  of 0.8 V at 0.4 msec.  In the right ventricle intrinsic amplitude is 11.4 mV  with an impedance of 430 Ohms and threshold of 0.7 V at 0.4 msec.  No  programming changes were made at this time.  As stated previously, she will  continue with monthly transtelephonic checks and be seen either in one  year's time or when her device reaches ERI.                                   Cleatrice Burke, RN                                Duke Salvia, MD, [facility]   CF/MedQ  DD:  02/12/2006  DT:  02/12/2006  Job #:  [id number]

## 2010-12-06 NOTE — Consult Note (Signed)
NAME:  Sandy Schmitt, Sandy Schmitt NO.:  [id number]   MEDICAL RECORD NO.:  [id number]          PATIENT TYPE:  EMS   LOCATION:  MAJO                         FACILITY:  [facility]   PHYSICIAN:  Arvilla Meres, M.D. LHCDATE OF BIRTH:  March 29, 1917   DATE OF CONSULTATION:  06/16/2004  DATE OF DISCHARGE:  06/16/2004                                   CONSULTATION   PRIMARY CARE PHYSICIAN:  Dr. Neal Dy in [place].   CARDIOLOGISTS:  1.  Doylene Canning. Ladona Ridgel, M.D.  2.  Learta Codding, M.D. [facility].   HISTORY OF PRESENT ILLNESS:  Sandy Schmitt is an 77 year old woman with  multiple medical problems including dementia, presumed coronary disease,  CHF, and tachy-brady syndrome, status post pacemaker, who was brought to the  [facility] ER secondary to chest pain.   According to our records, Sandy Schmitt has a history of a remote MI and heart  failure, with an EF ranging from 35-47%.  In 2001, she had a Cardiolite with  a fixed inferior defect as well as a small area of ischemia inferiorly,  which was treated medically.  Both Ms. Bega and her daughter who cares for  her deny any previous cardiac catheterization.   In July 2003, Sandy Schmitt was seen by Dr. Nona Dell in our office with  a question of chest pain.  However, given Sandy Schmitt's dementia, this  history is provided only by her daughter, and the patient was unfortunately  unable to corroborate this.  Given her advanced dementia and mild symptoms,  the decision was made to continue with medical therapy, and Imdur was added  to her regimen.  She did well until approximately two weeks ago when she was  admitted to [facility] with recurrent chest pain.  She was told she  had a mild heart attack and was discharged after three days of observation.  There was no further testing.  On followup with Dr. Coralyn Mark, her family was  told that she did not have any MI.  She has fortunately done well since that  time, without any recurrent chest  pain.   However, tonight she was going to bed and developed substernal chest pain  while in bed.  She called her daughter who gave her two sublingual  nitroglycerins over five minutes, and she had prompt relief of her chest  pain.  However, her daughter called EMS to be sure.  When [facility] arrived, Ms.  Schmitt was chest pain free, but there was some question of her pacemaker  going fast and slow, so she was brought to the [facility] emergency room for  further evaluation.   In the emergency room, her first set of cardiac enzymes were negative.  Her  paced rhythm was stable during the entire time of observation, and she had  no further chest pain.  Ms. Salaam was very interested in going home.  However, given her chest pain, there was some concern that she would need to  be evaluated by cardiology, and we were called.  Of note, given Sandy Schmitt's  dementia, she was only  able to provide certain aspects of her history, and  most of the information was gleaned from old records as well as Sandy Schmitt's  daughter who lives with her and cares for her.   PAST MEDICAL HISTORY:  1.  Presumed coronary artery disease, as per HPI.      1.  Cardiolite in 2001 showed an EF of 47%, with a fixed inferior defect          and small area of peri-infarct ischemia by report.  2.  A history of CHF, with an EF ranging 35-47% by report.  3.  Tachy-brady syndrome, with intermittent complete heart block, status      post pacemaker in 2000.  4.  Dementia.  5.  History of breast cancer, status post right mastectomy.  6.  History of colon cancer, status post right hemicolectomy.  7.  Obesity.   MEDICATIONS:  1.  Lipitor 10.  2.  Effexor 75 daily.  3.  Aciphex 20 daily.  4.  Sublingual nitroglycerin.  5.  Vicodin.  6.  Imdur 30 mg daily.  7.  Enteric coated aspirin 81 mg daily.  8.  MiraLax.  9.  Multivitamins.   ALLERGIES:  PENICILLIN.   SOCIAL HISTORY:  She lives in [place] with her daughter.   FAMILY  HISTORY:  Noncontributory.   EXAMINATION:  GENERAL:  She is lying flat in bed.  She is in no acute  distress.  She is a poor historian.  BLOOD PRESSURE:  137/70.  HEART RATE:  70.  SATURATION:  In the mid-90s on room air.  HEENT:  Sclerae anicteric.  EOMI.  NECK:  Supple.  Venous pressure is flat.  Carotids are 2+ bilaterally,  without bruits.  There is no thyromegaly or lymphadenopathy.  CARDIAC:  Regular rate and rhythm, with no murmurs, rubs, or gallops, though  she does have distant heart sounds.  LUNGS:  Clear to auscultation.  CHEST:  She does have pain on palpation over her sternum.  ABDOMEN:  Obese, nontender, nondistended, with no bruits.  There are no  palpable masses.  There are good bowel sounds.  EXTREMITIES:  Cool distally, with changes consistent with chronic venous  stasis.  There is no edema.  Distal pulses are 2+ bilaterally.  NEUROLOGIC:  She has a moderate amount of dementia and is unable to give  adequate details regarding her history.  Her neuro exam is otherwise  nonfocal.   LABORATORY DATA:  Troponin less than 0.05.  CK-MB less than 1.  Electrolytes  are not drawn.  EKG shows normal sinus rhythm, with ventricular pacing at  approximately 75 beats a minute.   ASSESSMENT AND PLAN:  [name] is an 77 year old woman with multiple  medical comorbidities including moderate to severe dementia and presumed  coronary disease, with remote positive stress test, who was brought to the  ER for an episode of chest pain which was resolved with two sublingual  nitroglycerins.  Currently, I am not clear if her chest pain is ischemic in  nature, but given her history I have suggested to both her and her daughter  that she be admitted for observation and complete rule out MI.  However, Ms.  Ohlendorf is quite anxious to go home and prefers not to be admitted.  As I am  not convinced that her chest pain is ischemic in nature, I have suggested to them that we draw a second set of  cardiac markers, and if these are negative  we  can let her go home with close followup.  I have also suggested adding  Toprol XL 25 mg and increasing her Imdur to 60 mg daily, and I have given  them prescriptions for this.  I have had a discussion with Ms. Alemu's  daughter about the slight increased risk associated with this approach, and  she seems to understand these well and agrees with the current plan of  management.  Of note, during this dictation Ms. Stigger's second set of  cardiac markers have come back and they remain negative, with a troponin of  less than 0.05.  Thus, we will let her go tonight with close followup.      Dani   DB/MEDQ  D:  06/16/2004  T:  06/16/2004  Job:  [id number]   cc:   Neal Dy, M.D.  [place]  [place] [facility]. Ladona Ridgel, M.D.   Learta Codding, M.D. [facility]   [facility] [place]  [address]  [address]  [place] [address]

## 2010-12-06 NOTE — Assessment & Plan Note (Signed)
[facility]                         ELECTROPHYSIOLOGY OFFICE NOTE   Sandy, Schmitt                       MRN:          [id number]  DATE:08/17/2006                            DOB:          05-Jul-1917    [name] returns today for a followup of an evaluation of ventricular  tachycardia.   HISTORY OF PRESENT ILLNESS:  The patient is a very pleasant, 77 year old  woman with a history of intermittent complete heart block who underwent  permanent pacemaker insertion back in 2000 and recent generator change  back in December.  She has been stable and has had no syncope.  Her  cardiac monitor on initial interrogation for her incision check  demonstrated bursts of ventricular tachycardia and she has now returned  for additional evaluation.  The patient has undergone a 2-D echo; the  results of which are presently unavailable to [place].  She denies any fevers  or chills.  She has had no syncope.  Otherwise, she is without  complaint.   EXAM:  She is a pleasant, chronically ill-appearing, obese, elderly  woman in no acute distress.  The blood pressure today 132/83.  The pulse was 70 and regular.  The  respirations were 18 and the weight was not recorded secondary to her  being in the wheelchair.  She has been in a wheelchair for many years.  NECK:  No jugular venous distention.  LUNGS:  Clear bilaterally to auscultation.  Rare rales in the bases.  CARDIOVASCULAR:  Regular rate and rhythm with a normal S1 and S2.  EXTREMITIES:  Trace peripheral edema bilaterally.  Her pacemaker incision was healed nicely.   Interrogation of her pacemaker demonstrates a Guidant Insignia with P  and R waves of 3 and 9 and impedence of 380 in the atrium and 430 in the  ventricle.  Threshold 0.9 at 0.4 in the atrium and 0.7 at 0.4 in the  right ventricle.  She had 11 high ventricular rate episodes which are  consistent with ventricular tachycardia.  These demonstrate a  ventricular rate of approximately 150-60 beats per minute.  These are  all nonsustained, although there is 1 fairly long nonsustained episode.   IMPRESSION:  1. Intermittent complete heart block and sinus bradycardia.  2. Status post pacemaker generator change secondary to device being in      elective replacement indicator.  3. Nonsustained (asymptomatic ventricular tachycardia in the setting      of mild to moderate left ventricular dysfunction).   DISCUSSION:  The patient is asymptomatic from an arrhythmia perspective  and she has had no syncope.  She does have LV dysfunction and has some  increased risk for sudden death;  however, at her advanced age and overall poor medical condition, a  continued period of  watchful-waiting would be recommended.  If she would develop symptomatic  VT, then consideration for anti-arrhythmia drug therapy would be  warranted.     Doylene Canning. Ladona Ridgel, MD  Electronically Signed    GWT/MedQ  DD: 08/17/2006  DT: 08/17/2006  Job #: [id number]

## 2010-12-08 ENCOUNTER — Encounter: Payer: Self-pay | Admitting: Internal Medicine

## 2010-12-08 DIAGNOSIS — I495 Sick sinus syndrome: Secondary | ICD-10-CM

## 2011-04-03 ENCOUNTER — Encounter: Payer: Self-pay | Admitting: Internal Medicine

## 2011-04-03 DIAGNOSIS — I495 Sick sinus syndrome: Secondary | ICD-10-CM

## 2011-07-03 ENCOUNTER — Encounter: Payer: Self-pay | Admitting: Internal Medicine

## 2011-07-03 DIAGNOSIS — I495 Sick sinus syndrome: Secondary | ICD-10-CM

## 2011-07-07 ENCOUNTER — Encounter: Payer: Self-pay | Admitting: Internal Medicine

## 2011-10-02 ENCOUNTER — Encounter: Payer: Self-pay | Admitting: Internal Medicine

## 2011-10-02 DIAGNOSIS — I495 Sick sinus syndrome: Secondary | ICD-10-CM

## 2011-10-21 ENCOUNTER — Encounter (HOSPITAL_COMMUNITY): Payer: Self-pay | Admitting: *Deleted

## 2011-10-21 ENCOUNTER — Emergency Department (HOSPITAL_COMMUNITY)
Admission: EM | Admit: 2011-10-21 | Discharge: 2011-10-21 | Disposition: A | Discharge status: Skilled Nursing Facility | Payer: Medicare Other | Attending: Emergency Medicine | Admitting: Emergency Medicine

## 2011-10-21 ENCOUNTER — Emergency Department (HOSPITAL_COMMUNITY): Payer: Medicare Other

## 2011-10-21 DIAGNOSIS — Z950 Presence of cardiac pacemaker: Secondary | ICD-10-CM | POA: Insufficient documentation

## 2011-10-21 DIAGNOSIS — I10 Essential (primary) hypertension: Secondary | ICD-10-CM | POA: Insufficient documentation

## 2011-10-21 DIAGNOSIS — Z79899 Other long term (current) drug therapy: Secondary | ICD-10-CM | POA: Insufficient documentation

## 2011-10-21 DIAGNOSIS — F0390 Unspecified dementia without behavioral disturbance: Secondary | ICD-10-CM | POA: Insufficient documentation

## 2011-10-21 DIAGNOSIS — K9420 Gastrostomy complication, unspecified: Secondary | ICD-10-CM

## 2011-10-21 HISTORY — DX: Malignant (primary) neoplasm, unspecified: C80.1

## 2011-10-21 HISTORY — DX: Depression, unspecified: F32.A

## 2011-10-21 HISTORY — DX: Major depressive disorder, single episode, unspecified: F32.9

## 2011-10-21 HISTORY — DX: Anorexia: R63.0

## 2011-10-21 HISTORY — DX: Essential (primary) hypertension: I10

## 2011-10-21 HISTORY — DX: Weakness: R53.1

## 2011-10-21 HISTORY — DX: Gastrostomy status: Z93.1

## 2011-10-21 HISTORY — DX: Unspecified osteoarthritis, unspecified site: M19.90

## 2011-10-21 HISTORY — DX: Other pulmonary embolism without acute cor pulmonale: I26.99

## 2011-10-21 NOTE — ED Notes (Signed)
Report called to nurse at [name]. EMS called to bring pt back

## 2011-10-21 NOTE — ED Provider Notes (Signed)
History     CSN: [id number]  Arrival date & time 10/21/11  1213   First MD Initiated Contact with Patient 10/21/11 1324      Chief Complaint  Patient presents with  . Illegal value: [    (Consider location/radiation/quality/duration/timing/severity/associated sxs/prior treatment) HPI Comments: Pt is demented, is unable to give any history or review of systems.  Patient is a 78 y.o. female presenting with GI illlness.  GI Problem  This is a new problem. Episode onset: Pt apparently sent in from a facility who reportedly pulled her gastrostomy tube out.  It is unknown when this took place. She has tried nothing (Pt transported to [facility] ED for evaluation.) for the symptoms.    Past Medical History  Diagnosis Date  . Chest pain   . CAD (coronary artery disease)   . Cancer   . Hypertension   . Osteoarthritis   . Weakness generalized   . Dysphagia   . Anorexia   . PE (pulmonary embolism)   . Depression   . G tube feedings     Past Surgical History  Procedure Date  . Cholecystectomy   . Hernia repair   . Mastectomy     Right, secondary to breast cancer  . Hemicolectomy     Right  . Abdominal hysterectomy   . Cystectomy     Right hand  . Gastrostomy tube placement     No family history on file.  History  Substance Use Topics  . Smoking status: Unknown If Ever Smoked  . Smokeless tobacco: Not on file  . Alcohol Use: No    OB History    Grav Para Term Preterm Abortions TAB SAB Ect Mult Living                  Review of Systems  Unable to perform ROS: Dementia    Allergies  Aspirin and Penicillins  Home Medications   Current Outpatient Rx  Name Route Sig Dispense Refill  . ASPIRIN 81 MG PO TABS Oral Take 81 mg by mouth daily.      . ATORVASTATIN CALCIUM 10 MG PO TABS Oral Take 10 mg by mouth daily.      Marland Kitchen CALCIUM CARBONATE 600 MG PO TABS Oral Take 600 mg by mouth daily.      Marland Kitchen VITAMIN D3 2000 UNITS PO CAPS Oral Take 2,000 Units by mouth daily.       Marland Kitchen CYPROHEPTADINE HCL 4 MG PO TABS Oral Take 4 mg by mouth daily.      Marland Kitchen FEXOFENADINE HCL 180 MG PO TABS Oral Take 180 mg by mouth daily.      . FUROSEMIDE 40 MG PO TABS Oral Take 40 mg by mouth daily.      Marland Kitchen METOPROLOL TARTRATE 25 MG PO TABS Oral Take 25 mg by mouth daily.      Marland Kitchen ONE-DAILY MULTI VITAMINS PO TABS Oral Take 1 tablet by mouth daily.      Marland Kitchen OMEPRAZOLE 20 MG PO CPDR Oral Take 20 mg by mouth daily.      Marland Kitchen POTASSIUM CHLORIDE 10 MEQ PO TBCR Oral Take 10 mEq by mouth daily.      . VENLAFAXINE HCL ER 150 MG PO CP24 Oral Take 150 mg by mouth daily.      Marland Kitchen VITAMIN C 500 MG PO TABS Oral Take 500 mg by mouth daily.      Marland Kitchen VITAMIN E 400 UNITS PO CAPS Oral Take 400 Units by  mouth daily.        BP 125/44  Pulse 73  Temp 98.4 F (36.9 C)  Resp 18  SpO2 97%  Physical Exam  Nursing note and vitals reviewed. Constitutional:       Elderly woman, edentulous, unable to give a history.  HENT:  Head: Normocephalic and atraumatic.  Right Ear: External ear normal.  Left Ear: External ear normal.       Edentulous.  Eyes: Conjunctivae and EOM are normal. Pupils are equal, round, and reactive to light.  Neck: Normal range of motion. Neck supple.  Cardiovascular: Normal rate, regular rhythm and normal heart sounds.   Pulmonary/Chest: Effort normal and breath sounds normal.       Pacemaker on left upper chest wall.   Abdominal: Soft. Bowel sounds are normal.       She has a gastrostomy site on the left upper quadrant.  Musculoskeletal: Normal range of motion. She exhibits no edema and no tenderness.  Neurological:       Profoundly demented.  No apparent sensory or motor deficits.  Skin: Skin is warm and dry.  Psychiatric:       Unable to assess.    ED Course  Procedures (including critical care time)  2:19 PM PROCEDURE:  Inserted 14 Foley catheter into gastrostomy site without complication.  Gastrograffin study to check tube positison ordered.   3:50 PM Gastrograffin study  shows tube is in stomach.  Call to Dr. Pearson Grippe, her PCP -->  4:07 PM He will arrange to have her have a gastrostomy tube put in, either by a gastroenterologist or interventional radiologist.    1. Gastrostomy complication           Carleene Cooper III, MD 10/21/11 [phone]

## 2011-10-21 NOTE — ED Notes (Addendum)
Pt sent to er from [facility] to have Peg-tube replaced, staff report that pt pulled out her feeding tube today, pt alert to her normal mental status, does not speak with staff, nursing home paperwork advises that pt is very slow to speak .  pt appears comfortable on stretcher.

## 2011-10-21 NOTE — Discharge Instructions (Signed)
Mrs. Kolasa had a 22 French foley catheter put in her gastrostomy site.  Dr. Pearson Grippe, her primary care physician, was called about her.  She will need to have the foley catheter replaced with a gastrostomy tube, either by a gastroenterologist or an interventional radiologist.  Dr. Selena Batten will call you with the details of getting this done.

## 2011-10-21 NOTE — ED Notes (Signed)
See triage note.

## 2011-10-22 ENCOUNTER — Telehealth: Payer: Self-pay

## 2011-10-22 NOTE — Telephone Encounter (Signed)
Called and informed Lupita Leash, nurse at [facility].

## 2011-10-22 NOTE — Telephone Encounter (Signed)
Gastroenterology Pre-Procedure Form  T/C from Saunders Revel, nurse at [name]. Pt needs peg tube replacement. She is from [place] and new at [facility]. She was seen in the ED on 10/21/2011. Has foley in and unable to get nutrition. They can only give some comfort foods like applesause at the time.   Pt does have a pacemaker  Request Date: 10/22/2011      Requesting Physician:      PATIENT INFORMATION:  Sandy Schmitt is a 78 y.o., female (DOB=April 20, 1917).  PROCEDURE: Procedure(s) requested: Peg tube placement Procedure Reason: Tube came out and she has foley in at the present time  PATIENT REVIEW QUESTIONS: The patient reports the following:   1. Diabetes Melitis: no 2. Joint replacements in the past 12 months: no 3. Major health problems in the past 3 months: no 4. Has an artificial valve or MVP:no 5. Has been advised in past to take antibiotics in advance of a procedure like teeth cleaning: no}    MEDICATIONS & ALLERGIES:    Patient reports the following regarding taking any blood thinners:   Plavix? no Aspirin?no Coumadin?  no  Patient confirms/reports the following medications:  Current Outpatient Prescriptions  Medication Sig Dispense Refill  . carbidopa-levodopa (SINEMET) 25-100 MG per tablet Take 1 tablet by mouth 3 (three) times daily.      Marland Kitchen donepezil (ARICEPT) 10 MG tablet Take 10 mg by mouth daily.      . Jevity 1.5 Cal (JEVITY 1.5) LIQD 237 mLs by PEG Tube route 4 (four) times daily.      Marland Kitchen acetaminophen (TYLENOL) 325 MG tablet Take 325 mg by mouth every 6 (six) hours as needed. Pain      . nystatin (MYCOSTATIN) 100000 UNIT/ML suspension Take 500,000 Units by mouth 3 (three) times daily.        Patient confirms/reports the following allergies:  Allergies  Allergen Reactions  . Aspirin   . Penicillins     Patient is appropriate to schedule for requested procedure(s): yes  AUTHORIZATION INFORMATION Primary Insurance:   ID #:   Group #:  Pre-Cert / Auth  required: Pre-Cert / Auth #:   Secondary Insurance:   ID #:  Group #:   Pre-Cert / Auth #:   No orders of the defined types were placed in this encounter.    SCHEDULE INFORMATION: Procedure has been scheduled as follows:  Date:                       Time:   Location:   This Gastroenterology Pre-Precedure Form is being routed to the following provider(s) for review: Jonette Eva, MD

## 2011-10-22 NOTE — Telephone Encounter (Signed)
CALL [name]. HOLD TUBE FEEDS. WILL COME REPLACE PEG AROUND 3pm TODAY.

## 2012-04-02 DIAGNOSIS — I495 Sick sinus syndrome: Secondary | ICD-10-CM

## 2012-08-17 ENCOUNTER — Encounter: Payer: Self-pay | Admitting: *Deleted

## 2012-09-07 ENCOUNTER — Telehealth: Payer: Self-pay | Admitting: Internal Medicine

## 2012-09-07 NOTE — Telephone Encounter (Signed)
09-07-12 certified letter rtn mail, phone # d/c, contact brenda mccullough number same as pt's, checked obits, pt not found/mt

## 2012-09-07 NOTE — Telephone Encounter (Signed)
Documented in [facility]kwm  

## 2012-09-27 IMAGING — RF DG FLUORO RM 1-60 MIN
3 series · 3 of 3 positions shown · non-contrast
Comparison: None

CLINICAL DATA: Gastrostomy tube replacement

FLOURO RM 1-60 MIN

[Series 1: run · 1 of 1 slices shown (1 of 3)]
[im 1/1]
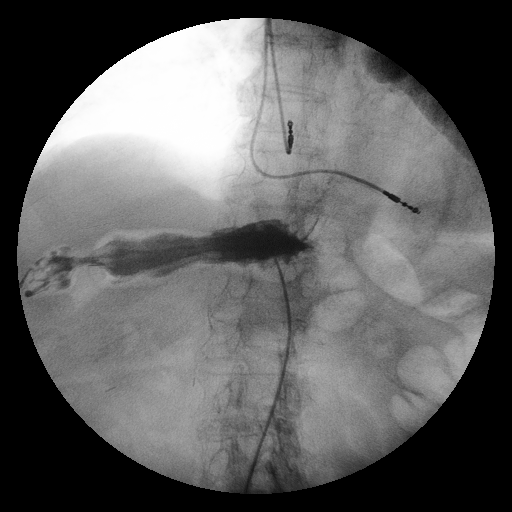

[Series 2: run · 1 of 1 slices shown (2 of 3)]
[im 1/1]
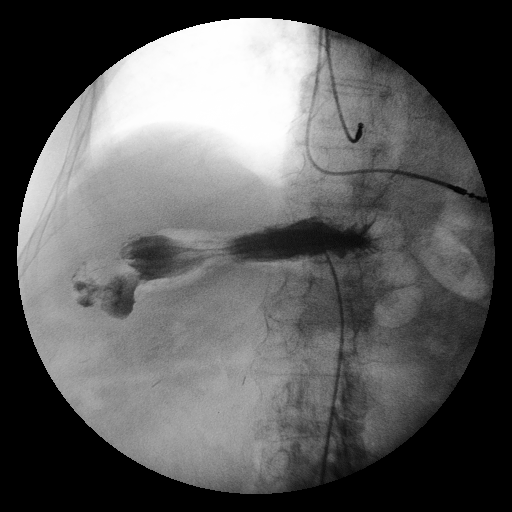

[Series 3: run · 1 of 1 slices shown (3 of 3)]
[im 1/1]
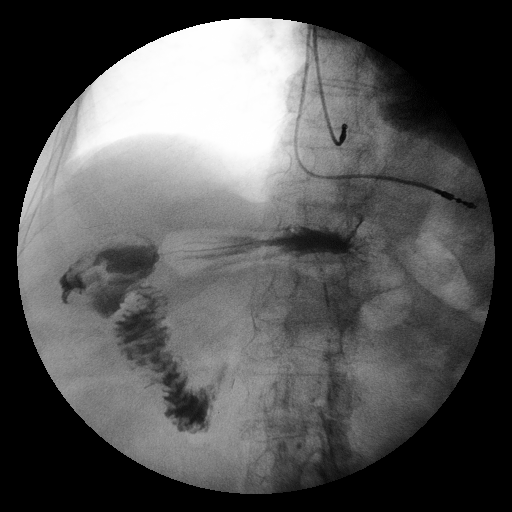

[3 of 3 positions shown; findings below may reference images not displayed]

FINDINGS: 0.5 minutes of fluoroscopy utilized.
15 ml of Ymnipaque-7BB injected into gastrostomy tube.
Contrast opacifies the gastric lumen and traverses the pylorus into
the duodenal bulb.
No contrast extravasation identified.
IMPRESSION: Replaced gastrostomy tube is within the gastric lumen.

## 2012-11-05 DIAGNOSIS — I495 Sick sinus syndrome: Secondary | ICD-10-CM

## 2012-11-22 ENCOUNTER — Other Ambulatory Visit (HOSPITAL_COMMUNITY): Payer: Self-pay | Admitting: Internal Medicine

## 2012-11-22 DIAGNOSIS — M25561 Pain in right knee: Secondary | ICD-10-CM

## 2012-11-23 ENCOUNTER — Ambulatory Visit (HOSPITAL_COMMUNITY)
Admission: RE | Admit: 2012-11-23 | Discharge: 2012-11-23 | Disposition: A | Discharge status: Home / Self Care | Payer: Medicare Other | Source: Ambulatory Visit | Attending: Internal Medicine | Admitting: Internal Medicine

## 2012-11-23 DIAGNOSIS — M25569 Pain in unspecified knee: Secondary | ICD-10-CM | POA: Insufficient documentation

## 2012-11-23 DIAGNOSIS — IMO0002 Reserved for concepts with insufficient information to code with codable children: Secondary | ICD-10-CM | POA: Insufficient documentation

## 2012-11-23 DIAGNOSIS — M1710 Unilateral primary osteoarthritis, unspecified knee: Secondary | ICD-10-CM | POA: Insufficient documentation

## 2012-11-23 DIAGNOSIS — M25561 Pain in right knee: Secondary | ICD-10-CM

## 2012-11-24 ENCOUNTER — Emergency Department (HOSPITAL_COMMUNITY)
Admission: EM | Admit: 2012-11-24 | Discharge: 2012-11-24 | Disposition: A | Discharge status: Home / Self Care | Payer: PRIVATE HEALTH INSURANCE | Attending: Emergency Medicine | Admitting: Emergency Medicine

## 2012-11-24 ENCOUNTER — Ambulatory Visit (HOSPITAL_COMMUNITY): Payer: Medicare Other

## 2012-11-24 ENCOUNTER — Telehealth: Payer: Self-pay | Admitting: Orthopedic Surgery

## 2012-11-24 ENCOUNTER — Encounter (HOSPITAL_COMMUNITY): Payer: Self-pay | Admitting: Emergency Medicine

## 2012-11-24 DIAGNOSIS — Z880 Allergy status to penicillin: Secondary | ICD-10-CM | POA: Insufficient documentation

## 2012-11-24 DIAGNOSIS — Z8719 Personal history of other diseases of the digestive system: Secondary | ICD-10-CM | POA: Insufficient documentation

## 2012-11-24 DIAGNOSIS — Z86711 Personal history of pulmonary embolism: Secondary | ICD-10-CM | POA: Insufficient documentation

## 2012-11-24 DIAGNOSIS — I10 Essential (primary) hypertension: Secondary | ICD-10-CM | POA: Insufficient documentation

## 2012-11-24 DIAGNOSIS — Y929 Unspecified place or not applicable: Secondary | ICD-10-CM | POA: Insufficient documentation

## 2012-11-24 DIAGNOSIS — Z853 Personal history of malignant neoplasm of breast: Secondary | ICD-10-CM | POA: Insufficient documentation

## 2012-11-24 DIAGNOSIS — X58XXXA Exposure to other specified factors, initial encounter: Secondary | ICD-10-CM | POA: Insufficient documentation

## 2012-11-24 DIAGNOSIS — F329 Major depressive disorder, single episode, unspecified: Secondary | ICD-10-CM | POA: Insufficient documentation

## 2012-11-24 DIAGNOSIS — Z79899 Other long term (current) drug therapy: Secondary | ICD-10-CM | POA: Insufficient documentation

## 2012-11-24 DIAGNOSIS — Y939 Activity, unspecified: Secondary | ICD-10-CM | POA: Insufficient documentation

## 2012-11-24 DIAGNOSIS — I2510 Atherosclerotic heart disease of native coronary artery without angina pectoris: Secondary | ICD-10-CM | POA: Insufficient documentation

## 2012-11-24 DIAGNOSIS — Z931 Gastrostomy status: Secondary | ICD-10-CM | POA: Insufficient documentation

## 2012-11-24 DIAGNOSIS — F0390 Unspecified dementia without behavioral disturbance: Secondary | ICD-10-CM | POA: Insufficient documentation

## 2012-11-24 DIAGNOSIS — F3289 Other specified depressive episodes: Secondary | ICD-10-CM | POA: Insufficient documentation

## 2012-11-24 DIAGNOSIS — Z8679 Personal history of other diseases of the circulatory system: Secondary | ICD-10-CM | POA: Insufficient documentation

## 2012-11-24 DIAGNOSIS — M1990 Unspecified osteoarthritis, unspecified site: Secondary | ICD-10-CM | POA: Insufficient documentation

## 2012-11-24 DIAGNOSIS — S82009A Unspecified fracture of unspecified patella, initial encounter for closed fracture: Secondary | ICD-10-CM | POA: Insufficient documentation

## 2012-11-24 DIAGNOSIS — S82891A Other fracture of right lower leg, initial encounter for closed fracture: Secondary | ICD-10-CM

## 2012-11-24 NOTE — ED Notes (Signed)
Patient from [name] with c/o knee fractures (old and new) to right knee. Sent for further evaluation.

## 2012-11-24 NOTE — ED Notes (Signed)
According to [name] LPN, Dr Jenetta Downer office was closed and Dr Romeo Apple was in surgery. They were "afraid she would not get an appointment tomorrow" so they sent her to the ED.

## 2012-11-24 NOTE — Telephone Encounter (Signed)
Sandy Schmitt from [facility] called requesting an appointment for Sandy Schmitt (08/16/2016) for tomorrow, for right knee fractures.  She said Sandy Schmitt is not mobile,  She has to be moved with a mechanical lift, and would be in a "geri chair".  Please review  the CT and advise about scheduling.  Not sure if this can be done in the office.  [facility] # [phone]

## 2012-11-24 NOTE — ED Provider Notes (Signed)
History     CSN: [id number]  Arrival date & time 11/24/12  1747   First MD Initiated Contact with Patient 11/24/12 1805      Chief Complaint  Patient presents with  . Knee Pain   Level V caveat for dementia  (Consider location/radiation/quality/duration/timing/severity/associated sxs/prior treatment) HPI  Patient sent from her nursing home for abnormal CT scan of her right knee today. The nursing home since a nurse's aide with her however she does not know any details about why the patient is here. Patient has dementia and states she feels fine. Per her caregiver and per her providers note from the nursing home patient has been non-ambulatory.  PCP Dr. Selena Batten  Past Medical History  Diagnosis Date  . Chest pain   . CAD (coronary artery disease)   . Cancer   . Hypertension   . Osteoarthritis   . Weakness generalized   . Dysphagia   . Anorexia   . PE (pulmonary embolism)   . Depression   . G tube feedings     Past Surgical History  Procedure Laterality Date  . Cholecystectomy    . Hernia repair    . Mastectomy      Right, secondary to breast cancer  . Hemicolectomy      Right  . Abdominal hysterectomy    . Cystectomy      Right hand  . Gastrostomy tube placement      No family history on file.  History  Substance Use Topics  . Smoking status: Unknown If Ever Smoked  . Smokeless tobacco: Not on file  . Alcohol Use: No   lives in nursing home  OB History   Grav Para Term Preterm Abortions TAB SAB Ect Mult Living                  Review of Systems  Unable to perform ROS: Dementia    Allergies  Aspirin and Penicillins  Home Medications   Current Outpatient Rx  Name  Route  Sig  Dispense  Refill  . donepezil (ARICEPT) 10 MG tablet   Oral   Take 10 mg by mouth daily.         . Vitamin D, Ergocalciferol, (DRISDOL) 50000 UNITS CAPS   Oral   Take 50,000 Units by mouth.         Marland Kitchen acetaminophen (TYLENOL) 325 MG tablet   Oral   Take 325 mg by  mouth every 6 (six) hours as needed. Pain         . carbidopa-levodopa (SINEMET) 25-100 MG per tablet   Oral   Take 1 tablet by mouth 3 (three) times daily.         . Jevity 1.5 Cal (JEVITY 1.5) LIQD   PEG Tube   237 mLs by PEG Tube route 4 (four) times daily.         Marland Kitchen nystatin (MYCOSTATIN) 100000 UNIT/ML suspension   Oral   Take 500,000 Units by mouth 3 (three) times daily.           BP 106/43  Pulse 70  Temp(Src) 98.3 F (36.8 C) (Oral)  Resp 22  SpO2 95%  Vital signs normal    Physical Exam  Nursing note and vitals reviewed. Constitutional:  Frail elderly female who is alert denies feeling bad  HENT:  Head: Normocephalic and atraumatic.  Right Ear: External ear normal.  Left Ear: External ear normal.  Edentulous  Eyes: Conjunctivae and EOM are normal. Pupils are  equal, round, and reactive to light.  Cardiovascular: Normal rate, regular rhythm and normal heart sounds.  Exam reveals no gallop and no friction rub.   No murmur heard. Pulmonary/Chest: Effort normal and breath sounds normal. No respiratory distress. She has no wheezes. She has no rales.  Abdominal: Soft. Bowel sounds are normal. She exhibits no distension. There is no tenderness.  Musculoskeletal:  Patient's noted to have contractures of both upper extremities. She has marked muscle wasting in her lower legs. Patient has a knee immobilizer on her right lower leg that is very loose. She is wearing padded boots on her feet  Neurological: She is alert.  Skin: Skin is warm and dry. No rash noted. No erythema.  Psychiatric: She has a normal mood and affect.    ED Course  Procedures (including critical care time)  Review of chart shows patient has no prior x-rays of her leg in our system. She also has no prior ED visits in the past one year.  I have tried to call the patient's daughter who is listed on her nursing home contact sheet, there is no answer. However the patient's legal guardians are  DSS. I have called to speak to the DSS worker on call. Lamont Dowdy and Ronalee Red are her guardians  19:46-19:54 Vickii Chafe DSS, discussed patient, she is going to make sure someone will follow up tomorrow.  20:16 Shon Baton, DSS, called, states she was aware of patient having xrays done earlier in the week and had possible fracture.  States the NH was going to do an investigation. She was also aware that patient was going to have a CT scan done to clarify her xrays. Will go to facility tomorrow.  20:32 D/W Dr [place], feels knee immobilizer is the only splinting she requires at this time.   The knee immobilizer was loosened and her leg examined. She has diffuse swelling of her right knee with bruising. She has a valgus deformity of her RLE. The knee immobilizer was reapplied by myself so it wasn't so loose.     Ct Knee Right Wo Contrast  11/23/2012  *RADIOLOGY REPORT*  Clinical Data: Right knee pain.  CT OF THE RIGHT KNEE WITHOUT CONTRAST  Technique:  Multidetector CT imaging was performed according to the standard protocol. Multiplanar CT image reconstructions were also generated.  Comparison: None.  Findings: There is a subacute or chronic partially healed comminuted fracture of the distal femur with impaction of the fracture fragments into the distal femur.  The femoral condyles are intact.  There is prominent callus formation around the fracture but the fracture has not completely healed.  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  There is also a fracture of the metaphyseal region of the medial aspect of the tibia as well as a vertical coronal fracture through the posterior aspect of the medial tibial plateau with no displacement.  The patient has severe tricompartmental osteoarthritis.  There is a joint effusion with extensive circumferential subcutaneous edema as well as a 2.9 x 2.4 x 1.2 cm hematoma in the subcutaneous fat adjacent to the  posterior medial aspect of the proximal tibia.  There is a healed fracture of the proximal shaft of the right tibia.  IMPRESSION: Multiple fractures around the right knee, some of which are old, some of which are subacute or old nonunion fractures, and some of which are probably acute such as the posterior medial tibial plateau fracture.  Subcutaneous edema and small subcutaneous  hematoma as described.   Original Report Authenticated By: Francene Boyers, M.D.      1. Fracture of knee region, right, closed, initial encounter     Plan discharge  Devoria Albe, MD, FACEP   MDM          Ward Givens, MD 11/24/12 2111

## 2012-11-25 NOTE — Telephone Encounter (Signed)
I agree that she is probably not a candidate for office visit so we will have to see her in the emergency room  I can see her on Monday at 9:30 AM if she is late she will not be seen  Have the ER call me when she arrives

## 2012-11-25 NOTE — Telephone Encounter (Signed)
Sandy Schmitt/[name] called for [name] about [name].  I relayed Dr. Mort Sawyers reply to her and also faxed the reply to Sandy at fax # [phone]. Sandy's phone # [phone]

## 2012-11-29 ENCOUNTER — Emergency Department (HOSPITAL_COMMUNITY)
Admission: EM | Admit: 2012-11-29 | Discharge: 2012-11-29 | Disposition: A | Discharge status: Home / Self Care | Payer: Medicare Other | Attending: Orthopedic Surgery | Admitting: Orthopedic Surgery

## 2012-11-29 ENCOUNTER — Encounter (HOSPITAL_COMMUNITY): Payer: Self-pay

## 2012-11-29 DIAGNOSIS — Y929 Unspecified place or not applicable: Secondary | ICD-10-CM | POA: Insufficient documentation

## 2012-11-29 DIAGNOSIS — Z86711 Personal history of pulmonary embolism: Secondary | ICD-10-CM | POA: Insufficient documentation

## 2012-11-29 DIAGNOSIS — G20A1 Parkinson's disease without dyskinesia, without mention of fluctuations: Secondary | ICD-10-CM | POA: Insufficient documentation

## 2012-11-29 DIAGNOSIS — I10 Essential (primary) hypertension: Secondary | ICD-10-CM | POA: Insufficient documentation

## 2012-11-29 DIAGNOSIS — Z880 Allergy status to penicillin: Secondary | ICD-10-CM | POA: Insufficient documentation

## 2012-11-29 DIAGNOSIS — Z79899 Other long term (current) drug therapy: Secondary | ICD-10-CM | POA: Insufficient documentation

## 2012-11-29 DIAGNOSIS — Y939 Activity, unspecified: Secondary | ICD-10-CM | POA: Insufficient documentation

## 2012-11-29 DIAGNOSIS — G20 Parkinson's disease: Secondary | ICD-10-CM | POA: Insufficient documentation

## 2012-11-29 DIAGNOSIS — X58XXXA Exposure to other specified factors, initial encounter: Secondary | ICD-10-CM | POA: Insufficient documentation

## 2012-11-29 DIAGNOSIS — Z931 Gastrostomy status: Secondary | ICD-10-CM | POA: Insufficient documentation

## 2012-11-29 DIAGNOSIS — Z859 Personal history of malignant neoplasm, unspecified: Secondary | ICD-10-CM | POA: Insufficient documentation

## 2012-11-29 DIAGNOSIS — S72453A Displaced supracondylar fracture without intracondylar extension of lower end of unspecified femur, initial encounter for closed fracture: Secondary | ICD-10-CM

## 2012-11-29 DIAGNOSIS — Z8719 Personal history of other diseases of the digestive system: Secondary | ICD-10-CM | POA: Insufficient documentation

## 2012-11-29 DIAGNOSIS — I2510 Atherosclerotic heart disease of native coronary artery without angina pectoris: Secondary | ICD-10-CM | POA: Insufficient documentation

## 2012-11-29 DIAGNOSIS — M1990 Unspecified osteoarthritis, unspecified site: Secondary | ICD-10-CM | POA: Insufficient documentation

## 2012-11-29 DIAGNOSIS — S72451A Displaced supracondylar fracture without intracondylar extension of lower end of right femur, initial encounter for closed fracture: Secondary | ICD-10-CM

## 2012-11-29 DIAGNOSIS — F0390 Unspecified dementia without behavioral disturbance: Secondary | ICD-10-CM | POA: Insufficient documentation

## 2012-11-29 DIAGNOSIS — Z8659 Personal history of other mental and behavioral disorders: Secondary | ICD-10-CM | POA: Insufficient documentation

## 2012-11-29 HISTORY — DX: Parkinson's disease without dyskinesia, without mention of fluctuations: G20.A1

## 2012-11-29 HISTORY — DX: Other malaise: R53.81

## 2012-11-29 HISTORY — DX: Contracture, unspecified joint: M24.50

## 2012-11-29 HISTORY — DX: Insomnia, unspecified: G47.00

## 2012-11-29 HISTORY — DX: Other fatigue: R53.83

## 2012-11-29 HISTORY — DX: Gastro-esophageal reflux disease without esophagitis: K21.9

## 2012-11-29 HISTORY — DX: Pressure ulcer of unspecified site, unspecified stage: L89.90

## 2012-11-29 HISTORY — DX: Parkinson's disease: G20

## 2012-11-29 NOTE — Consult Note (Signed)
Reason for Consult: Fracture right femur Referring Physician: Emergency room Dr. Mickel Fuchs MASHELL SIEBEN is an 79 y.o. female.   History the patient presented to the emergency room last week with multiple fractures of varying ages. She was in the nursing home under the care of social services who agreed she's in an unsafe environment and are on the case.  She's not a surgical candidate based on the condition listed below she is nonambulatory the fractures are in various healing stages. She does not give a history she does not communicate  I had  to bring the patient in to  the emergency room for her visit secondary to immobility, no one to lift her.and the chair would not fit thru our doors at the office    HPI: This patient presented to the emergency room last week with multiple fractures Past Medical History  Diagnosis Date  . Chest pain   . CAD (coronary artery disease)   . Cancer   . Hypertension   . Osteoarthritis   . Weakness generalized   . Dysphagia   . Anorexia   . PE (pulmonary embolism)   . Depression   . G tube feedings   . Insomnia   . Osteoarthritis   . Pressure ulcer   . Contracted, joint   . Acid reflux   . Paralysis agitans   . Malaise   . Fatigue     Past Surgical History  Procedure Laterality Date  . Cholecystectomy    . Hernia repair    . Mastectomy      Right, secondary to breast cancer  . Hemicolectomy      Right  . Abdominal hysterectomy    . Cystectomy      Right hand  . Gastrostomy tube placement      History reviewed. No pertinent family history.  Social History:  reports that she does not drink alcohol or use illicit drugs. Her tobacco history is not on file.  Allergies:  Allergies  Allergen Reactions  . Aspirin   . Penicillins     Medications: I have reviewed the patient's current medications.  No results found for this or any previous visit (from the past 48 hour(s)).  No results found.  Review of Systems  Unable to perform  ROS: dementia   Blood pressure 116/34, pulse 71, temperature 98.4 F (36.9 C), temperature source Oral, resp. rate 18, SpO2 100.00%. Physical Exam  Assessment/Plan: Recommend nonoperative treatment with knee immobilizer which was adjusted today in the emergency room  And x-ray can be obtained in approximately 6 weeks just to check on healing and alignment    Fuller Canada 11/29/2012, 10:24 AM

## 2012-11-29 NOTE — ED Notes (Signed)
Awaiting transport back to NH facility.

## 2012-11-29 NOTE — ED Notes (Signed)
Pt arrived by ems from [facility], ?right knee injury, staff reports that ortho md is coming to see the pt.  Pt unable to answer.

## 2012-11-29 NOTE — ED Notes (Signed)
[facility] at bedside for transport. Patient left ED at this time with EMS. No distress.

## 2012-11-30 ENCOUNTER — Telehealth: Payer: Self-pay | Admitting: Orthopedic Surgery

## 2012-11-30 NOTE — Telephone Encounter (Signed)
Lurena Joiner at [facility] asked if it is OK to get Matteson Blue up in the [place] lift to move her to the wheelchair so she will not have to be in the bed all day.  Lurena Joiner or [name]  [phone]

## 2012-12-01 NOTE — Telephone Encounter (Signed)
Discharge Instructions    Surgery not recommended  Wear brace all the time for 12 weeks  No weight bearing  Check skin every 8 hours  Ask nursing home physician about dvt prevention  mobilze bed to chair daily    These are the insrtuctions in the chart tell them to look

## 2012-12-01 NOTE — Telephone Encounter (Signed)
yes

## 2012-12-01 NOTE — Telephone Encounter (Signed)
Gave the doctor's reply to [employer].  She said they did have the discharge instructions as noted below.

## 2012-12-23 ENCOUNTER — Encounter: Payer: Self-pay | Admitting: Internal Medicine

## 2013-09-21 ENCOUNTER — Encounter: Payer: Self-pay | Admitting: Internal Medicine

## 2013-09-21 DIAGNOSIS — I495 Sick sinus syndrome: Secondary | ICD-10-CM

## 2013-11-16 ENCOUNTER — Telehealth (INDEPENDENT_AMBULATORY_CARE_PROVIDER_SITE_OTHER): Payer: PRIVATE HEALTH INSURANCE

## 2013-11-16 DIAGNOSIS — E638 Other specified nutritional deficiencies: Secondary | ICD-10-CM

## 2013-11-16 NOTE — Telephone Encounter (Signed)
Ixchel from [name] called on this patient. They are wanting to know if you would be able to replace her peg tube. She has one now that is working but it is old and looks molded. Her stoma is also inflamed. Please advise

## 2013-11-17 NOTE — Telephone Encounter (Signed)
REVIEWED.  

## 2013-11-17 NOTE — Telephone Encounter (Signed)
PT IN BED A17 B2

## 2013-11-17 NOTE — Telephone Encounter (Signed)
PT SEEN AND EXAMINED. HAS A BUMPER PEG. NEEDS XARELTO HELD TODAY. VO GIVEN TO NURSING. CHANGE PEG MAY 1.

## 2013-11-17 NOTE — Telephone Encounter (Signed)
Sandy Schmitt is aware to hold tube feeding and that [facility] will be coming over at 12 to change the peg tube. She is also going to fax over a med list for the patient.

## 2013-11-17 NOTE — Telephone Encounter (Signed)
PLEASE CALL PT'S FACILITY. I NEED AN UPDATED MED LIST. HOLD TUBE FEEDS. I WILL CHANGE PEG AROUND 12N TODAY.

## 2013-11-18 NOTE — Telephone Encounter (Signed)
  MALFUNCTIONING PEG. Sandy Schmitt.  PLAN:  BEDSIDE PEG CHANGE   PROCEDURE TECHNIQUE:  PT PREPPED AND DRAPED. Traction applied. 20 fr BUMPER PEG REMOVED. 20 Fr BALLLON PEG REPLACED. 6 CC STERILE WATER IN BALLOON. TOP OF BUMPER AT 5 CM. SMALL AMOUNT OF BLEEDING WITH REMOVAL AND INSERTION OF PEG. PT TOLERATED PROCEDURE WELL.  PLAN:  1. RE-START XARELTO MAY 2. 2. MAY PLACE GAUZE ON TOP OF BUMPER IF SITE OOZING.    3. RE-START TUBE FEEDS TODAY.

## 2014-07-11 ENCOUNTER — Emergency Department (HOSPITAL_COMMUNITY): Payer: Medicare Other

## 2014-07-11 ENCOUNTER — Emergency Department (HOSPITAL_COMMUNITY)
Admission: EM | Admit: 2014-07-11 | Discharge: 2014-07-11 | Disposition: A | Discharge status: Home / Self Care | Payer: Medicare Other | Attending: Gastroenterology | Admitting: Gastroenterology

## 2014-07-11 ENCOUNTER — Encounter (HOSPITAL_COMMUNITY): Payer: Self-pay | Admitting: Emergency Medicine

## 2014-07-11 DIAGNOSIS — Z792 Long term (current) use of antibiotics: Secondary | ICD-10-CM | POA: Diagnosis not present

## 2014-07-11 DIAGNOSIS — M1990 Unspecified osteoarthritis, unspecified site: Secondary | ICD-10-CM | POA: Diagnosis not present

## 2014-07-11 DIAGNOSIS — F329 Major depressive disorder, single episode, unspecified: Secondary | ICD-10-CM | POA: Insufficient documentation

## 2014-07-11 DIAGNOSIS — I10 Essential (primary) hypertension: Secondary | ICD-10-CM | POA: Insufficient documentation

## 2014-07-11 DIAGNOSIS — Z880 Allergy status to penicillin: Secondary | ICD-10-CM | POA: Diagnosis not present

## 2014-07-11 DIAGNOSIS — T85528A Displacement of other gastrointestinal prosthetic devices, implants and grafts, initial encounter: Secondary | ICD-10-CM

## 2014-07-11 DIAGNOSIS — I2510 Atherosclerotic heart disease of native coronary artery without angina pectoris: Secondary | ICD-10-CM | POA: Diagnosis not present

## 2014-07-11 DIAGNOSIS — Z872 Personal history of diseases of the skin and subcutaneous tissue: Secondary | ICD-10-CM | POA: Insufficient documentation

## 2014-07-11 DIAGNOSIS — Z79899 Other long term (current) drug therapy: Secondary | ICD-10-CM | POA: Diagnosis not present

## 2014-07-11 DIAGNOSIS — K9423 Gastrostomy malfunction: Secondary | ICD-10-CM | POA: Diagnosis present

## 2014-07-11 DIAGNOSIS — T85598A Other mechanical complication of other gastrointestinal prosthetic devices, implants and grafts, initial encounter: Secondary | ICD-10-CM

## 2014-07-11 DIAGNOSIS — R1310 Dysphagia, unspecified: Secondary | ICD-10-CM

## 2014-07-11 DIAGNOSIS — F0390 Unspecified dementia without behavioral disturbance: Secondary | ICD-10-CM | POA: Insufficient documentation

## 2014-07-11 DIAGNOSIS — Z7901 Long term (current) use of anticoagulants: Secondary | ICD-10-CM | POA: Diagnosis not present

## 2014-07-11 DIAGNOSIS — Z431 Encounter for attention to gastrostomy: Secondary | ICD-10-CM

## 2014-07-11 DIAGNOSIS — Z8669 Personal history of other diseases of the nervous system and sense organs: Secondary | ICD-10-CM | POA: Diagnosis not present

## 2014-07-11 DIAGNOSIS — Z931 Gastrostomy status: Secondary | ICD-10-CM

## 2014-07-11 NOTE — Procedures (Signed)
  PROCEDURE TECHNIQUE:  PT EXAMINED PEG TUBE SITE W/O G TUBE. TRACT DILATED FROM 8 MM TO 10 MM.  INITIALLY 8 MM DILATOR MET RESISTANCE. EACH DILATOR HELD FOR 30 SEC TO 1 MINUTE. 20 fr BUMPER PEG BALLOON CHECKED. 20 Fr BS BALLOON PEG INSERTED W/O RESISTANCE. 6 CC STERILE WATER IN BALLOON. SKIN AT 5 CM. TOP OF BUMPER AT 7 CM. ALL PORTS FLUSHED AND ASPIRATED.  PLAN: 1. G TUBE STUDY. IF PLACEMENT CONFIRMED, MAY USE PEG FOR TUBE FEEDS/MEDS TODAY. 2. OPV PRN.

## 2014-07-11 NOTE — ED Notes (Signed)
20French 5ccBalloon peg tube inserted by Dr. Oneida Alar at this time. PT tolerated well. MD to order peg tube radiology study for verification of placement.

## 2014-07-11 NOTE — ED Notes (Signed)
Communications called and will transport pt back to [name] when a truck is available.

## 2014-07-11 NOTE — Discharge Instructions (Signed)
Care of a Feeding Tube Feeding tubes are often given to those who have trouble swallowing or cannot take food or medicine. A feeding tube can:   Go into the nose and down to the stomach.  Go through the skin in the belly (abdomen) and into the stomach or small bowel. SUPPLIES NEEDED TO CARE FOR THE TUBE SITE  Clean gloves.  Clean wash cloth, gauze pads, or soft paper towel.  Cotton swabs.  Skin barrier ointment or cream.  Soap and water.  Precut foam pads or gauze (that go around the tube).  Tube tape. TUBE SITE CARE 1. Have all supplies ready. 2. Wash hands well. 3.  Put on clean gloves. 4. Remove dirty foam pads or gauze near the tube site, if present. 5. Check the skin around the tube site for redness, rash, puffiness (swelling), leaking fluid, or extra tissue growth. Call your doctor if you see any of these. 6. Wet the gauze and cotton swabs with water and soap. 7. Wipe the area closest to the tube with cotton swabs. Wipe the surrounding skin with moistened gauze. Rinse with water. 8. Dry the skin and tube site with a dry gauze pad or soft paper towel. Do not use antibiotic ointments at the tube site. 9. If the skin is red, apply petroleum jelly in a circular motion, using a cotton swab. Your doctor may suggest a different cream or ointment. Use what the doctor suggests. 10. Apply a new pre-cut foam pad or gauze around the tube. Tape the edges down. Foam pads or gauze may be left off if there is no fluid at the tube site. 11. Use tape or a device that will attach your feeding tube to your skin or do as directed. Rotate where you tape the tube. 12. Sit the person up. 13. Throw away used supplies. 14. Remove gloves. 15. Wash hands. SUPPLIES NEEDED TO FLUSH A FEEDING TUBE  Clean gloves.  60 mL syringe (that connects to feeding tube).  Towel.  Water. FLUSHING A FEEDING TUBE  1. Have all supplies ready. 2. Wash hands well. 3. Put on clean gloves. 4. Pull 30 mL of  water into the syringe. 5. Bend (kink) the feeding tube while disconnecting it from the feeding-bag tubing or while removing the plug at the end of the tube. 6. Insert the tip of the syringe into the end of the feeding tube. Stop bending the tube. Slowly inject the water. 7. If you cannot inject the water, the person with the feeding tube should lay on their left side. 8. After injecting the water, remove the syringe. 9. Always flush the tube before giving the first medicine, between medicines, and after the final medicine before starting a feeding. 10. Throw away used supplies. 11. Remove gloves. 12. Wash hands. Document Released: 03/31/2012 Document Reviewed: 03/31/2012 [facility] Patient Information 2015 [place]. This information is not intended to replace advice given to you by your health care provider. Make sure you discuss any questions you have with your health care provider.   Follow-up with gastroenterologist for any problems.

## 2014-07-11 NOTE — ED Notes (Signed)
Nursing home reports noting peg tube was removed by patient this am approximal 0500. Peg tube sent to ED with pt and it is a [employer]) 20FR with 6cc balloon.

## 2014-07-11 NOTE — ED Notes (Signed)
Dr. Oneida Alar (gastroenterology) at bedside at this time.

## 2014-07-11 NOTE — ED Notes (Signed)
Pt cleaned of incontinent stool and urine and bedlinens changed. Waiting on EMS for transport at this time.

## 2014-07-11 NOTE — Assessment & Plan Note (Signed)
PEG TRACT DILATED AND PEG REPLACED.

## 2014-07-11 NOTE — ED Notes (Signed)
ER MD made aware pt needs her d/c papers from him and Dr. Oneida Alar in order to be transported back to [name] (SNF).

## 2014-07-11 NOTE — ED Provider Notes (Addendum)
CSN: [id number]     Arrival date & time 07/11/14  1696 History  This chart was scribed for Nat Christen, MD by Zola Button, ED Scribe. This patient was seen in room APA14/APA14 and the patient's care was started at 9:00 AM.      Chief Complaint  Patient presents with  . peg tube placement    peg tube placement    The history is provided by the nursing home. The history is limited by the condition of the patient. No language interpreter was used.   Level 5 Caveat: Dementia HPI Comments: Sandy Schmitt is a 80 y.o. female with a hx of gastrostomy tube placement who presents to the Emergency Department for a PEG tube placement. [name] nursing home staff reports that the PEG tube was removed by the patient around 5 am this morning; the PEG tube has been sent to the ED with the patient and it is a [employer] with 6cc balloon. She does not know when the PEG tube was originally placed in the patient.   Past Medical History  Diagnosis Date  . Chest pain   . CAD (coronary artery disease)   . Cancer   . Hypertension   . Osteoarthritis   . Weakness generalized   . Dysphagia   . Anorexia   . PE (pulmonary embolism)   . Depression   . G tube feedings   . Insomnia   . Osteoarthritis   . Pressure ulcer   . Contracted, joint   . Acid reflux   . Paralysis agitans   . Malaise   . Fatigue    Past Surgical History  Procedure Laterality Date  . Cholecystectomy    . Hernia repair    . Mastectomy      Right, secondary to breast cancer  . Hemicolectomy      Right  . Abdominal hysterectomy    . Cystectomy      Right hand  . Gastrostomy tube placement     No family history on file. History  Substance Use Topics  . Smoking status: Unknown If Ever Smoked  . Smokeless tobacco: Not on file  . Alcohol Use: No   OB History    No data available     Review of Systems  Unable to perform ROS: Dementia      Allergies  Aspirin; Penicillins; and Septra  Home Medications    Prior to Admission medications   Medication Sig Start Date End Date Taking? Authorizing Provider  acetaminophen (TYLENOL) 325 MG tablet Take 650 mg by mouth 2 (two) times daily. Pain    Historical Provider, MD  carbidopa-levodopa (SINEMET) 25-100 MG per tablet Take 1 tablet by mouth 3 (three) times daily.    Historical Provider, MD  cephALEXin (KEFLEX) 500 MG capsule Take 500 mg by mouth 4 (four) times daily. For 7 days (started on 11/20/2012 @@ 1800)    Historical Provider, MD  donepezil (ARICEPT) 10 MG tablet Take 10 mg by mouth daily.    Historical Provider, MD  feeding supplement (PRO-STAT SUGAR FREE 64) LIQD Take 30 mLs by mouth daily.    Historical Provider, MD  Jevity 1.5 Cal (JEVITY 1.5) LIQD 237 mLs by PEG Tube route 5 (five) times daily.     Historical Provider, MD  rivaroxaban (XARELTO) 10 MG TABS tablet Take 10 mg by mouth daily.    Historical Provider, MD  traMADol (ULTRAM) 50 MG tablet Take 50 mg by mouth 2 (two) times daily.  Historical Provider, MD  Vitamin D, Ergocalciferol, (DRISDOL) 50000 UNITS CAPS Take 50,000 Units by mouth.    Historical Provider, MD   BP 92/56 mmHg  Pulse 85  Temp(Src) 98.8 F (37.1 C) (Oral)  Resp 18  Ht 5\' 4"  (1.626 m)  Wt 150 lb (68.04 kg)  BMI 25.73 kg/m2  SpO2 100% Physical Exam  Constitutional:  Demented.  HENT:  Head: Normocephalic and atraumatic.  Eyes: Conjunctivae and EOM are normal. Pupils are equal, round, and reactive to light.  Neck: Normal range of motion. Neck supple.  Cardiovascular: Normal rate and regular rhythm.   Pulmonary/Chest: Effort normal and breath sounds normal.  Abdominal: Soft. Bowel sounds are normal.  Ostomy in upper abdomen.  Musculoskeletal:  Unable.  Neurological:  Unable   Skin: Skin is warm and dry.  Psychiatric:  Demented.  Nursing note and vitals reviewed.   ED Course  Procedures  DIAGNOSTIC STUDIES: Oxygen Saturation is 99% on room air, normal by my interpretation.    COORDINATION OF  CARE: 9:17 AM-Discussed treatment plan which includes XR and PEG tube placement with pt at bedside and pt agreed to plan.    Labs Review Labs Reviewed - No data to display  Imaging Review No results found.   EKG Interpretation None      MDM   Final diagnoses:  Feeding tube dysfunction, initial encounter    I attempted to replace the feeding tube unsuccessfully. Will consult gastroenterologist Dr. Oneida Alar.  1300:  PEG tube replaced by Dr.Fields.  Follow-up x-ray shows tube placement appropriate  I personally performed the services described in this documentation, which was scribed in my presence. The recorded information has been reviewed and is accurate.    Nat Christen, MD 07/11/14 [place], MD 07/11/14 [phone]

## 2014-09-13 ENCOUNTER — Encounter: Payer: Medicaid Other | Admitting: Internal Medicine

## 2014-11-09 ENCOUNTER — Encounter: Payer: Self-pay | Admitting: Internal Medicine

## 2014-11-09 DIAGNOSIS — R001 Bradycardia, unspecified: Secondary | ICD-10-CM | POA: Diagnosis not present

## 2014-11-15 ENCOUNTER — Ambulatory Visit (INDEPENDENT_AMBULATORY_CARE_PROVIDER_SITE_OTHER): Payer: Medicare Other | Admitting: Internal Medicine

## 2014-11-15 ENCOUNTER — Encounter: Payer: Self-pay | Admitting: Internal Medicine

## 2014-11-15 VITALS — BP 140/62 | HR 68 | Wt 125.0 lb

## 2014-11-15 DIAGNOSIS — I495 Sick sinus syndrome: Secondary | ICD-10-CM

## 2014-11-15 DIAGNOSIS — I2510 Atherosclerotic heart disease of native coronary artery without angina pectoris: Secondary | ICD-10-CM

## 2014-11-15 DIAGNOSIS — Z950 Presence of cardiac pacemaker: Secondary | ICD-10-CM | POA: Diagnosis not present

## 2014-11-15 LAB — MDC_IDC_ENUM_SESS_TYPE_INCLINIC
Date Time Interrogation Session: 20160427040000
Implantable Pulse Generator Serial Number: 318799
Lead Channel Impedance Value: 400 Ohm
Lead Channel Pacing Threshold Amplitude: 0.8 V
Lead Channel Pacing Threshold Pulse Width: 0.3 ms
Lead Channel Pacing Threshold Pulse Width: 0.4 ms
Lead Channel Sensing Intrinsic Amplitude: 0.9 mV
Lead Channel Sensing Intrinsic Amplitude: 10.3 mV
Lead Channel Setting Pacing Amplitude: 2 V
Lead Channel Setting Pacing Pulse Width: 0.3 ms
Lead Channel Setting Sensing Sensitivity: 2.5 mV
MDC IDC MSMT LEADCHNL RA IMPEDANCE VALUE: 300 Ohm
MDC IDC MSMT LEADCHNL RA PACING THRESHOLD AMPLITUDE: 0.6 V
MDC IDC MSMT LEADCHNL RA PACING THRESHOLD AMPLITUDE: 1 V
MDC IDC MSMT LEADCHNL RA PACING THRESHOLD PULSEWIDTH: 0.4 ms
MDC IDC MSMT LEADCHNL RV PACING THRESHOLD AMPLITUDE: 0.8 V
MDC IDC MSMT LEADCHNL RV PACING THRESHOLD PULSEWIDTH: 0.3 ms
MDC IDC SET LEADCHNL RV PACING AMPLITUDE: 2 V
MDC IDC STAT BRADY RA PERCENT PACED: 28 %
MDC IDC STAT BRADY RV PERCENT PACED: 94 %

## 2014-11-15 NOTE — Patient Instructions (Addendum)
Your physician wants you to follow-up in: 12 months with Dr. Lovena Le.  You will receive a reminder letter in the mail two months in advance. If you don't receive a letter, please call our office to schedule the follow-up appointment.    Your physician recommends that you continue on your current medications as directed. Please refer to the Current Medication list given to you today.   Thank you for choosing [facility]!

## 2014-11-15 NOTE — Assessment & Plan Note (Signed)
She does not complain of any pain. Will follow. No change in meds.

## 2014-11-15 NOTE — Progress Notes (Signed)
HPI Sandy Schmitt returns today after a long absence from our arrhythmia clinic. She is a 81 yo woman with a h/o symptomatic sinus node dysfunction, s/p PPM insertion. She has profound dementia and has been wheel chair bound and unable to speak for years. She does not answer questions. She apparently has not been in the hospital.  Allergies  Allergen Reactions  . Aspirin   . Penicillins   . Septra [Sulfamethoxazole-Trimethoprim]      Current Outpatient Prescriptions  Medication Sig Dispense Refill  . acetaminophen (TYLENOL) 325 MG tablet Take 650 mg by mouth 2 (two) times daily. Pain    . carbidopa-levodopa (SINEMET) 25-100 MG per tablet Take 1 tablet by mouth 3 (three) times daily.    . cephALEXin (KEFLEX) 500 MG capsule Take 500 mg by mouth 4 (four) times daily. For 7 days (started on 11/20/2012 @@ 1800)    . donepezil (ARICEPT) 10 MG tablet Take 10 mg by mouth daily.    . feeding supplement (PRO-STAT SUGAR FREE 64) LIQD Take 30 mLs by mouth daily.    . Jevity 1.5 Cal (JEVITY 1.5) LIQD 237 mLs by PEG Tube route 5 (five) times daily.     . rivaroxaban (XARELTO) 10 MG TABS tablet Take 10 mg by mouth daily.    . traMADol (ULTRAM) 50 MG tablet Take 50 mg by mouth 2 (two) times daily.    . Vitamin D, Ergocalciferol, (DRISDOL) 50000 UNITS CAPS Take 50,000 Units by mouth.     No current facility-administered medications for this visit.     Past Medical History  Diagnosis Date  . Chest pain   . CAD (coronary artery disease)   . Cancer   . Hypertension   . Osteoarthritis   . Weakness generalized   . Dysphagia   . Anorexia   . PE (pulmonary embolism)   . Depression   . G tube feedings   . Insomnia   . Osteoarthritis   . Pressure ulcer   . Contracted, joint   . Acid reflux   . Paralysis agitans   . Malaise   . Fatigue     ROS:   All systems reviewed and negative except as noted in the HPI.   Past Surgical History  Procedure Laterality Date  . Cholecystectomy    .  Hernia repair    . Mastectomy      Right, secondary to breast cancer  . Hemicolectomy      Right  . Abdominal hysterectomy    . Cystectomy      Right hand  . Gastrostomy tube placement       No family history on file.   History   Social History  . Marital Status: Single    Spouse Name: N/A  . Number of Children: N/A  . Years of Education: N/A   Occupational History  . Retired    Social History Main Topics  . Smoking status: Unknown If Ever Smoked  . Smokeless tobacco: Not on file  . Alcohol Use: No  . Drug Use: No  . Sexual Activity: No   Other Topics Concern  . Not on file   Social History Narrative   Widowed   Lives with daughter   No regular exercise     BP 140/62 mmHg  Pulse 68  Wt 125 lb (56.7 kg)  Physical Exam:  Chronically ill, wheel chair bound, elderly woman appearing in NAD HEENT: Unremarkable Neck:  6 cm JVD, no thyromegally  Back:  No CVA tenderness Lungs:  Clear HEART:  Regular rate rhythm, no murmurs, no rubs, no clicks Abd:  soft, positive bowel sounds, no organomegally, no rebound, no guarding Ext:  2 plus pulses, no edema, no cyanosis, no clubbing Skin:  No rashes no nodules Neuro:  Unable to follow commands.   DEVICE  Normal device function.  See PaceArt for details.   Assess/Plan:

## 2014-11-15 NOTE — Assessment & Plan Note (Signed)
Her [employer] DDD PM is working normally. She is 1.5 years to ERI. We have reprogrammed her device in an attempt to increase the longevity.

## 2014-12-22 ENCOUNTER — Encounter: Payer: Self-pay | Admitting: Internal Medicine

## 2015-02-22 ENCOUNTER — Ambulatory Visit (HOSPITAL_COMMUNITY)
Admission: RE | Admit: 2015-02-22 | Discharge: 2015-02-22 | Disposition: A | Discharge status: Home / Self Care | Payer: Medicare Other | Source: Ambulatory Visit | Attending: Gastroenterology | Admitting: Gastroenterology

## 2015-02-22 ENCOUNTER — Encounter (HOSPITAL_COMMUNITY): Payer: Self-pay | Admitting: *Deleted

## 2015-02-22 ENCOUNTER — Telehealth: Payer: Self-pay

## 2015-02-22 ENCOUNTER — Other Ambulatory Visit: Payer: Self-pay

## 2015-02-22 ENCOUNTER — Encounter (HOSPITAL_COMMUNITY)
Admission: RE | Disposition: A | Discharge status: Home / Self Care | Payer: Self-pay | Source: Ambulatory Visit | Attending: Gastroenterology

## 2015-02-22 DIAGNOSIS — T85598A Other mechanical complication of other gastrointestinal prosthetic devices, implants and grafts, initial encounter: Secondary | ICD-10-CM | POA: Diagnosis not present

## 2015-02-22 DIAGNOSIS — Z431 Encounter for attention to gastrostomy: Secondary | ICD-10-CM

## 2015-02-22 HISTORY — PX: PEG PLACEMENT: SHX5437

## 2015-02-22 SURGERY — REPLACEMENT, PEG TUBE, WITHOUT ENDOSCOPY
Anesthesia: LOCAL

## 2015-02-22 NOTE — Telephone Encounter (Signed)
SEND TO [facility] FOR PEG CHANGE.

## 2015-02-22 NOTE — Procedures (Signed)
MEDS REVIEWED. PT TAKING XARELTO.   PROCEDURE TECHNIQUE:  PT PREPPED AND DRAPED. 20 fr FOLEY CATHETER BALLON ASPIRATED AND FOLEY REMOVED. BALLOON/PORTS CHECKED ON BS 20 Fr BALLOON PEG. REPLACED WITH 20 FR BALLOON PEG. 6 CC STERILE WATER IN BALLOON. SKIN AT 4.5 CM. TOP OF BUMPER AT 7 CM. ALL PORTS FLUSHED AND ASPIRATED.  PLAN: 1. TUBE FEEDS/MEDS TODAY. 2. OPV PRN. 3. OK TO START XARELTO TODAY.

## 2015-02-22 NOTE — Telephone Encounter (Signed)
Noted and orders entered

## 2015-02-22 NOTE — Telephone Encounter (Signed)
[facility] called office this morning. Spoke with April Reid (nurse) and states that patients peg tube came out sometime early this morning. States that a 16 french foley with a 5cc bulb is hold site for right now.  Needs to know if Dr. Oneida Alar can come to facility to replace or if pt needs to have this done at hosptial.  Please advise

## 2015-02-26 ENCOUNTER — Encounter (HOSPITAL_COMMUNITY): Payer: Self-pay | Admitting: Gastroenterology

## 2015-10-09 ENCOUNTER — Ambulatory Visit (HOSPITAL_BASED_OUTPATIENT_CLINIC_OR_DEPARTMENT_OTHER): Admission: RE | Admit: 2015-10-09 | Payer: Medicare Other | Source: Ambulatory Visit | Admitting: Gastroenterology

## 2015-10-09 ENCOUNTER — Telehealth: Payer: Self-pay

## 2015-10-09 DIAGNOSIS — R1310 Dysphagia, unspecified: Secondary | ICD-10-CM

## 2015-10-09 SURGERY — REPLACEMENT, PEG TUBE, WITHOUT ENDOSCOPY
Anesthesia: LOCAL

## 2015-10-09 NOTE — Op Note (Signed)
[facility] Patient Name: Sandy Schmitt Procedure Date: 10/09/2015 9:14 PM MRN: [id number] Date of Birth: Jul 05, 1917 Attending MD: Barney Drain , MD CSN: [id number] Age: 82 Admit Type: Outpatient Procedure:                Non-endoscopic Tube Procedure Indications:              Routine exchange PEG tube, Replace PEG tube Providers:                Barney Drain, MD Referring MD:              Medicines:                 Complications:            No immediate complications. Estimated Blood Loss:     Estimated blood loss was minimal. Procedure:                Pre-Anesthesia Assessment:                           - Prior to the procedure, a History and Physical                            was performed, and patient medications and                            allergies were reviewed. The patient's tolerance of                            previous anesthesia was also reviewed. The risks                            and benefits of the procedure and the sedation                            options and risks were discussed with the patient.                            All questions were answered, and informed consent                            was obtained. Prior Anticoagulants: The patient has                            taken Xarelto (rivaroxaban), last dose was day of                            procedure. ASA Grade Assessment: II - A patient                            with mild systemic disease. After reviewing the                            risks and benefits, the patient was deemed in  satisfactory condition to undergo the procedure.                           After obtaining informed consent, the site was                            prepped and the procedure was performed. The                            procedure was accomplished with ease. The patient                            tolerated the procedure well. Findings:      The existing gastrostomy site was examined and  cleaned. A 20 Fr BOSTON       SCIENTIFIC gastrostomy tube was lubricated and placed into the existing       gastrostomy port. A total of 6 mL saline was used to distend the balloon       that was previously tested. When positioned, the skin marking was noted       to be 6 cm at the external bumper. The final tension and compression of       the abdominal wall by the gastrostomy tube and external bumper were       checked and revealed that the bumper was moderately tight and mildly       deforming the skin. Placement into the stomach was confirmed with       flushing, aspiration and auscultation. The tube was capped, and the tube       site was cleaned and dressed. Estimated blood loss was minimal. Impression:               - The previously removed gastrostomy tube was                            replaced with a 20 Fr D[place]line gastrostomy                            tube.                           - No specimens collected. Moderate Sedation:      none Recommendation:           - Resume previous diet.                           - Please follow the post-PEG recommendations                            including: may use PEG today for meds and water. Procedure Code(s):        --- Professional ---                           [phone], Change of gastrostomy tube, percutaneous,                            without imaging or endoscopic guidance Diagnosis Code(s):        ---  Professional ---                           Z43.1, Encounter for attention to gastrostomy CPT copyright 2016 American Medical Association. All rights reserved. The codes documented in this report are preliminary and upon coder review may  be revised to meet current compliance requirements. Barney Drain, MD Barney Drain, MD 10/09/2015 9:19:56 PM This report has been signed electronically. Number of Addenda: 0

## 2015-10-09 NOTE — Telephone Encounter (Signed)
PEG REPLACED AT BEDSIDE AT [facility]. TOP OF BUMPER AT 6 CM.

## 2015-10-09 NOTE — Procedures (Signed)
  PROCEDURE TECHNIQUE:  PT PREPPED AND DRAPED. 20 fr FOLEY REMOVED. BALLOON CHECKED. REPLACED WITH 20 FR BALLOON PEG. 6 CC STERILE WATER IN BALLOON. SKIN AT 4 CM. TOP OF BUMPER AT 6 CM. ALL PORTS FLUSHED AND ASPIRATED.  PLAN: 1. TUBE FEEDS/MEDS TODAY. 2. REPLACE PEG at bedside.

## 2015-10-09 NOTE — Telephone Encounter (Signed)
Sandy Schmitt called to inform us that her PEG has come out and a foley is in place to hold hole open. She is not able to eat or drink by mouth. She has only had medication today via tube. She is on Eliquis. Please advise

## 2015-10-09 NOTE — Telephone Encounter (Signed)
Pt instructed to come.

## 2015-11-07 ENCOUNTER — Ambulatory Visit (INDEPENDENT_AMBULATORY_CARE_PROVIDER_SITE_OTHER): Payer: Medicare Other | Admitting: Internal Medicine

## 2015-11-07 ENCOUNTER — Encounter: Payer: Self-pay | Admitting: Internal Medicine

## 2015-11-07 VITALS — BP 146/65 | HR 66 | Ht 63.0 in | Wt 157.0 lb

## 2015-11-07 DIAGNOSIS — R079 Chest pain, unspecified: Secondary | ICD-10-CM | POA: Diagnosis not present

## 2015-11-07 DIAGNOSIS — I495 Sick sinus syndrome: Secondary | ICD-10-CM

## 2015-11-07 LAB — CUP PACEART INCLINIC DEVICE CHECK
Battery Remaining Longevity: 12 mo
Date Time Interrogation Session: 20170419104414
Implantable Lead Implant Date: 20000529
Implantable Lead Location: 753859
Lead Channel Pacing Threshold Amplitude: 0.9 V
Lead Channel Pacing Threshold Amplitude: 1 V
Lead Channel Sensing Intrinsic Amplitude: 0.5 mV
Lead Channel Sensing Intrinsic Amplitude: 11.5 mV
Lead Channel Setting Pacing Amplitude: 2 V
Lead Channel Setting Pacing Pulse Width: 0.3 ms
Lead Channel Setting Sensing Sensitivity: 2.5 mV
MDC IDC LEAD IMPLANT DT: 20000529
MDC IDC LEAD LOCATION: 753860
MDC IDC MSMT LEADCHNL RA IMPEDANCE VALUE: 290 Ohm
MDC IDC MSMT LEADCHNL RA PACING THRESHOLD PULSEWIDTH: 0.3 ms
MDC IDC MSMT LEADCHNL RV IMPEDANCE VALUE: 360 Ohm
MDC IDC MSMT LEADCHNL RV PACING THRESHOLD PULSEWIDTH: 0.3 ms
MDC IDC SET LEADCHNL RA PACING AMPLITUDE: 2 V
Pulse Gen Model: 1297
Pulse Gen Serial Number: 318799

## 2015-11-07 NOTE — Progress Notes (Signed)
HPI Mrs. Brandner returns today after a long absence from our arrhythmia clinic. She is a 82 yo woman with a h/o symptomatic sinus node dysfunction, s/p PPM insertion. She has profound dementia and has been wheel chair bound and unable to speak for years. She does not answer questions. She apparently has not been in the hospital. She receives nutrition via a gastric tube. Allergies  Allergen Reactions  . Aspirin   . Penicillins   . Septra [Sulfamethoxazole-Trimethoprim]      Current Outpatient Prescriptions  Medication Sig Dispense Refill  . acetaminophen (TYLENOL) 325 MG tablet Take 650 mg by mouth 2 (two) times daily. Pain    . apixaban (ELIQUIS) 5 MG TABS tablet Take 5 mg by mouth 2 (two) times daily.    . carbidopa-levodopa (SINEMET) 25-100 MG per tablet Take 1 tablet by mouth 3 (three) times daily.    Marland Kitchen donepezil (ARICEPT) 10 MG tablet Take 10 mg by mouth daily.    . feeding supplement (PRO-STAT SUGAR FREE 64) LIQD Take 30 mLs by mouth daily.    Marland Kitchen omeprazole (PRILOSEC) 20 MG capsule Take 20 mg by mouth daily.    . traMADol (ULTRAM) 50 MG tablet Take 50 mg by mouth 2 (two) times daily.    . Jevity 1.5 Cal (JEVITY 1.5) LIQD 237 mLs by PEG Tube route 5 (five) times daily. Reported on 11/07/2015    . Vitamin D, Ergocalciferol, (DRISDOL) 50000 UNITS CAPS Take 50,000 Units by mouth. Reported on 11/07/2015     No current facility-administered medications for this visit.     Past Medical History  Diagnosis Date  . Chest pain   . CAD (coronary artery disease)   . Cancer ([place])   . Hypertension   . Osteoarthritis   . Weakness generalized   . Dysphagia   . Anorexia   . PE (pulmonary embolism)   . Depression   . G tube feedings ([place])   . Insomnia   . Osteoarthritis   . Pressure ulcer   . Contracted, joint   . Acid reflux   . Paralysis agitans ([place])   . Malaise   . Fatigue     ROS:   All systems reviewed and negative except as noted in the HPI.   Past Surgical  History  Procedure Laterality Date  . Cholecystectomy    . Hernia repair    . Mastectomy      Right, secondary to breast cancer  . Hemicolectomy      Right  . Abdominal hysterectomy    . Cystectomy      Right hand  . Gastrostomy tube placement    . Peg placement N/A 02/22/2015    Procedure: PERCUTANEOUS ENDOSCOPIC GASTROSTOMY (PEG) REPLACEMENT;  Surgeon: Danie Binder, MD;  Location: [facility]O SUITE;  Service: Endoscopy;  Laterality: N/A;     No family history on file.   Social History   Social History  . Marital Status: Single    Spouse Name: N/A  . Number of Children: N/A  . Years of Education: N/A   Occupational History  . Retired    Social History Main Topics  . Smoking status: Unknown If Ever Smoked  . Smokeless tobacco: Not on file  . Alcohol Use: No  . Drug Use: No  . Sexual Activity: No   Other Topics Concern  . Not on file   Social History Narrative   Widowed   Lives with daughter   No regular exercise  BP 146/65 mmHg  Pulse 66  Ht 5\' 3"  (1.6 m)  Wt 157 lb (71.215 kg)  BMI 27.82 kg/m2  SpO2 96%  Physical Exam:  Chronically ill, wheel chair bound, elderly woman appearing in NAD, not speaking HEENT: Unremarkable Neck:  6 cm JVD, no thyromegally Back:  No CVA tenderness Lungs:  Clear with no wheezes HEART:  Regular rate rhythm, no murmurs, no rubs, no clicks Abd:  soft, positive bowel sounds, no organomegally, no rebound, no guarding Ext:  2 plus pulses, no edema, no cyanosis, no clubbing Skin:  No rashes no nodules Neuro:  Unable to follow commands.   DEVICE  Normal device function.  See PaceArt for details.   Assess/Plan: 1. Complete heart block - she is s/p ppm insertion 2. Dementia - she does not speak and is fed by G-tube 3. PPM - her [employer] DDD PM is working normally. She is about a year from ERI. I would not expect to place a new device when she reaches ERI.  Mikle Bosworth.D.

## 2015-11-07 NOTE — Patient Instructions (Signed)
Your physician wants you to follow-up in: 1 Year with Dr. Taylor. You will receive a reminder letter in the mail two months in advance. If you don't receive a letter, please call our office to schedule the follow-up appointment.  Your physician recommends that you continue on your current medications as directed. Please refer to the Current Medication list given to you today.  If you need a refill on your cardiac medications before your next appointment, please call your pharmacy.  Thank you for choosing  [facility]!    

## 2016-02-22 ENCOUNTER — Encounter: Payer: Self-pay | Admitting: Internal Medicine

## 2016-02-22 ENCOUNTER — Telehealth: Payer: Self-pay | Admitting: Internal Medicine

## 2016-02-22 DIAGNOSIS — I442 Atrioventricular block, complete: Secondary | ICD-10-CM | POA: Diagnosis not present

## 2016-02-22 NOTE — Telephone Encounter (Signed)
New message      Calling with a abnormal pacemaker result

## 2016-02-22 NOTE — Telephone Encounter (Signed)
Sandy Schmitt w/ [facility] called and said that pt is nearing ERI. Pt will start monthly battery checks.

## 2016-03-18 ENCOUNTER — Telehealth: Payer: Self-pay

## 2016-03-18 NOTE — Telephone Encounter (Signed)
Called pt schedule apt for device clinic for battery check. No answer/Busy.

## 2016-03-18 NOTE — Telephone Encounter (Signed)
Attempted to call pts emergency contact to schedule apt for pt d/t pts device nearing ERI. Phone disconnected. Msg sent to Lorenda Hatchet to try and reach pt.

## 2016-03-19 ENCOUNTER — Encounter: Payer: Self-pay | Admitting: Internal Medicine

## 2016-04-15 ENCOUNTER — Encounter: Payer: Self-pay | Admitting: Internal Medicine

## 2016-04-29 ENCOUNTER — Telehealth: Payer: Self-pay

## 2016-04-29 ENCOUNTER — Encounter (HOSPITAL_COMMUNITY): Payer: Self-pay | Admitting: *Deleted

## 2016-04-29 ENCOUNTER — Other Ambulatory Visit: Payer: Self-pay

## 2016-04-29 ENCOUNTER — Ambulatory Visit (HOSPITAL_COMMUNITY)
Admission: AD | Admit: 2016-04-29 | Discharge: 2016-04-29 | Disposition: A | Discharge status: Skilled Nursing Facility | Payer: Medicare Other | Source: Ambulatory Visit | Attending: Gastroenterology | Admitting: Gastroenterology

## 2016-04-29 ENCOUNTER — Telehealth: Payer: Self-pay | Admitting: Gastroenterology

## 2016-04-29 ENCOUNTER — Encounter (HOSPITAL_COMMUNITY)
Admission: AD | Disposition: A | Discharge status: Skilled Nursing Facility | Payer: Self-pay | Source: Ambulatory Visit | Attending: Gastroenterology

## 2016-04-29 DIAGNOSIS — G20 Parkinson's disease: Secondary | ICD-10-CM | POA: Diagnosis not present

## 2016-04-29 DIAGNOSIS — Z79899 Other long term (current) drug therapy: Secondary | ICD-10-CM | POA: Diagnosis not present

## 2016-04-29 DIAGNOSIS — I2510 Atherosclerotic heart disease of native coronary artery without angina pectoris: Secondary | ICD-10-CM | POA: Insufficient documentation

## 2016-04-29 DIAGNOSIS — I10 Essential (primary) hypertension: Secondary | ICD-10-CM | POA: Insufficient documentation

## 2016-04-29 DIAGNOSIS — Z7901 Long term (current) use of anticoagulants: Secondary | ICD-10-CM | POA: Diagnosis not present

## 2016-04-29 DIAGNOSIS — K219 Gastro-esophageal reflux disease without esophagitis: Secondary | ICD-10-CM | POA: Diagnosis not present

## 2016-04-29 DIAGNOSIS — Z86711 Personal history of pulmonary embolism: Secondary | ICD-10-CM | POA: Diagnosis not present

## 2016-04-29 DIAGNOSIS — Z431 Encounter for attention to gastrostomy: Secondary | ICD-10-CM

## 2016-04-29 DIAGNOSIS — K9423 Gastrostomy malfunction: Secondary | ICD-10-CM | POA: Diagnosis not present

## 2016-04-29 HISTORY — PX: PEG PLACEMENT: SHX5437

## 2016-04-29 SURGERY — INSERTION, PEG TUBE
Anesthesia: LOCAL

## 2016-04-29 NOTE — Discharge Instructions (Signed)
RESUME TUBE FEEDS AND MEDS VIA PEG. SKIN AT 5 CM AND TOP OF BUMPER AT 7 CM.  OK TO CONTINUE ELIQUIS AND PREVIOUS MEDS.  ROUTINE PEG CARE.  OUTPATIENT VISIT IF NEEDED.

## 2016-04-29 NOTE — Telephone Encounter (Signed)
Estill Bamberg from [facility] called about resident needing a peg tube replacement. Please call her back at [phone]

## 2016-04-29 NOTE — Telephone Encounter (Signed)
SEND TO [facility] FOR PEG CHANGE.

## 2016-04-29 NOTE — Telephone Encounter (Signed)
I called and informed Estill Bamberg and she said they will take pt To Endo.

## 2016-04-29 NOTE — Telephone Encounter (Signed)
REVIEWED-NO ADDITIONAL RECOMMENDATIONS. 

## 2016-04-29 NOTE — H&P (Addendum)
Primary Care Physician:  Jani Gravel, MD Primary Gastroenterologist:  Dr. Oneida Alar  Pre-Procedure History & Physical: HPI:  Sandy Schmitt is a 82 y.o. female here for PEG CHANGE.  Past Medical History:  Diagnosis Date  . Acid reflux   . Anorexia   . CAD (coronary artery disease)   . Cancer ([place])   . Chest pain   . Contracted, joint   . Depression   . Dysphagia   . Fatigue   . G tube feedings ([place])   . Hypertension   . Insomnia   . Malaise   . Osteoarthritis   . Osteoarthritis   . Paralysis agitans ([place])   . PE (pulmonary embolism)   . Pressure ulcer   . Weakness generalized     Past Surgical History:  Procedure Laterality Date  . ABDOMINAL HYSTERECTOMY    . CHOLECYSTECTOMY    . CYSTECTOMY     Right hand  . GASTROSTOMY TUBE PLACEMENT    . HEMICOLECTOMY     Right  . HERNIA REPAIR    . MASTECTOMY     Right, secondary to breast cancer  . PEG PLACEMENT N/A 02/22/2015   Procedure: PERCUTANEOUS ENDOSCOPIC GASTROSTOMY (PEG) REPLACEMENT;  Surgeon: Danie Binder, MD;  [facility];  Service: Endoscopy;  Laterality: N/A;    Prior to Admission medications   Medication Sig Start Date End Date Taking? Authorizing Provider  acetaminophen (TYLENOL) 325 MG tablet Take 650 mg by mouth 2 (two) times daily. Pain    Historical Provider, MD  apixaban (ELIQUIS) 5 MG TABS tablet Take 5 mg by mouth 2 (two) times daily.    Historical Provider, MD  carbidopa-levodopa (SINEMET) 25-100 MG per tablet Take 1 tablet by mouth 3 (three) times daily.    Historical Provider, MD  donepezil (ARICEPT) 10 MG tablet Take 10 mg by mouth daily.    Historical Provider, MD  feeding supplement (PRO-STAT SUGAR FREE 64) LIQD Take 30 mLs by mouth daily.    Historical Provider, MD  Jevity 1.5 Cal (JEVITY 1.5) LIQD 237 mLs by PEG Tube route 5 (five) times daily. Reported on 11/07/2015    Historical Provider, MD  omeprazole (PRILOSEC) 20 MG capsule Take 20 mg by mouth daily.    Historical Provider, MD   traMADol (ULTRAM) 50 MG tablet Take 50 mg by mouth 2 (two) times daily.    Historical Provider, MD  Vitamin D, Ergocalciferol, (DRISDOL) 50000 UNITS CAPS Take 50,000 Units by mouth. Reported on 11/07/2015    Historical Provider, MD    Allergies as of 04/29/2016 - Review Complete 04/29/2016  Allergen Reaction Noted  . Aspirin  12/19/2009  . Penicillins  12/19/2009  . Septra [sulfamethoxazole-trimethoprim]  07/11/2014    No family history on file.  Social History   Social History  . Marital status: Single    Spouse name: N/A  . Number of children: N/A  . Years of education: N/A   Occupational History  . Retired    Social History Main Topics  . Smoking status: Unknown If Ever Smoked  . Smokeless tobacco: Not on file  . Alcohol use No  . Drug use: No  . Sexual activity: No   Other Topics Concern  . Not on file   Social History Narrative   Widowed   Lives with daughter   No regular exercise    Review of Systems: See HPI, otherwise negative ROS   Physical Exam: BP 122/63   Temp 99.2 F (37.3 C)  Resp 15   SpO2 100%  General:   Alert,  pleasant and cooperative in NAD Head:  Normocephalic and atraumatic. Neck:  Supple; Lungs:  Clear throughout to auscultation.    Abdomen:  Soft, nontender and nondistended. Normal bowel sounds, PEG SITE WITH GRANULATION TISSUE. SLIGHT HEME ON GAUZE.Marland Kitchen   Neurologic:  NON-VERBAL  Impression/Plan:     MALFUNCTIONING PEG  PLAN:  BEDSIDE PEG CHANGE

## 2016-04-29 NOTE — Telephone Encounter (Signed)
Called and spoke with nurse to set up appointment to discuss battery/ generator replacement. Apt made for 05/12/2016 at 3:45pm with Dr. Lovena Le.

## 2016-04-29 NOTE — Op Note (Signed)
[facility] Patient Name: Sandy Schmitt Procedure Date: 04/29/2016 2:24 PM MRN: [id number] Date of Birth: 11/16/1916 Attending MD: Barney Drain , MD CSN: [id number] Age: 82 Admit Type: Outpatient Procedure:                Non-endoscopic Tube Procedure Indications:              Exchange PEG tube due to malfunctioning gastrostomy                            tube Providers:                Barney Drain, MD Referring MD:              Medicines:                 Complications:            No immediate complications. Estimated Blood Loss:      Procedure:                After obtaining informed consent, the site was                            prepped and the procedure was performed. The                            procedure was accomplished without difficulty. The                            patient tolerated the procedure well. Findings:      Upon external examination, bleeding was found surrounding the stomal       opening. The gastrostomy tube was malfunctioning.      The gastrostomy tube was not functioning and required removal. The       existing PEG site was cleaned. The existing PEG balloon was deflated and       by using traction, removal was easily accomplished. A 20 Fr EndoVive       Safety gastrostomy tube was lubricated and placed into the existing       gastrostomy port. A total of 6 mL saline was used to distend the balloon       that was previously tested. When positioned, the skin marking was noted       to be 5 cm at the external bumper. The final tension and compression of       the abdominal wall by the gastrostomy tube and external bumper were       checked and revealed that the bumper was loose and lightly touching the       skin. Placement into the stomach was confirmed with flushing, aspiration       and auscultation. The tube was capped, and the tube site was cleaned and       dressed. The total fluoroscopy exposure time was ZERO. Impression:                 Moderate Sedation:      NONE Recommendation:           - Discharge patient to a nursing home VIA EMS. Procedure Code(s):        --- Professional ---  [id number], Change of gastrostomy tube, percutaneous,                            without imaging or endoscopic guidance Diagnosis Code(s):        --- Professional ---                           K94.23, Gastrostomy malfunction CPT copyright 2016 American Medical Association. All rights reserved. The codes documented in this report are preliminary and upon coder review may  be revised to meet current compliance requirements. Barney Drain, MD Barney Drain, MD 04/29/2016 2:28:21 PM This report has been signed electronically. Number of Addenda: 0

## 2016-04-29 NOTE — Telephone Encounter (Signed)
PT's peg came out yesterday. She had her last meal yesterday and meds. They have a foley in. Sending to Dr. Oneida Alar to advise!

## 2016-05-07 ENCOUNTER — Encounter (HOSPITAL_COMMUNITY): Payer: Self-pay | Admitting: Gastroenterology

## 2016-05-12 ENCOUNTER — Ambulatory Visit (INDEPENDENT_AMBULATORY_CARE_PROVIDER_SITE_OTHER): Payer: Medicare Other | Admitting: Internal Medicine

## 2016-05-12 ENCOUNTER — Encounter: Payer: Self-pay | Admitting: Internal Medicine

## 2016-05-12 VITALS — BP 120/64 | HR 76 | Ht 63.0 in | Wt 168.0 lb

## 2016-05-12 DIAGNOSIS — Z950 Presence of cardiac pacemaker: Secondary | ICD-10-CM

## 2016-05-12 DIAGNOSIS — I495 Sick sinus syndrome: Secondary | ICD-10-CM | POA: Diagnosis not present

## 2016-05-12 NOTE — Progress Notes (Signed)
HPI Sandy Schmitt returns today for PPM followup. She is a 82 yo woman with a h/o symptomatic sinus node dysfunction, s/p PPM insertion. She has profound dementia and has been wheel chair bound and unable to speak for years. She does not answer questions. She was in the hospital for PEG tube replacement.  Allergies  Allergen Reactions  . Aspirin   . Penicillins   . Septra [Sulfamethoxazole-Trimethoprim]      Current Outpatient Prescriptions  Medication Sig Dispense Refill  . acetaminophen (TYLENOL) 325 MG tablet Take 650 mg by mouth 2 (two) times daily. Pain    . apixaban (ELIQUIS) 5 MG TABS tablet Take 5 mg by mouth 2 (two) times daily.    . carbidopa-levodopa (SINEMET) 25-100 MG per tablet Take 1 tablet by mouth 3 (three) times daily.    Marland Kitchen donepezil (ARICEPT) 10 MG tablet Take 10 mg by mouth daily.    . feeding supplement (PRO-STAT SUGAR FREE 64) LIQD Take 30 mLs by mouth daily.    . Nutritional Supplements (ISOSOURCE 1.5 CAL) LIQD 1.5 mL/hr by PEG Tube route.    Marland Kitchen omeprazole (PRILOSEC) 20 MG capsule Take 20 mg by mouth daily.    . traMADol (ULTRAM) 50 MG tablet Take 50 mg by mouth 2 (two) times daily.    . Vitamin D, Ergocalciferol, (DRISDOL) 50000 UNITS CAPS Take 50,000 Units by mouth. Reported on 11/07/2015     No current facility-administered medications for this visit.      Past Medical History:  Diagnosis Date  . Acid reflux   . Anorexia   . CAD (coronary artery disease)   . Cancer ([place])   . Chest pain   . Contracted, joint   . Depression   . Dysphagia   . Fatigue   . G tube feedings ([place])   . Hypertension   . Insomnia   . Malaise   . Osteoarthritis   . Osteoarthritis   . Paralysis agitans ([place])   . PE (pulmonary embolism)   . Pressure ulcer   . Weakness generalized     ROS:   All systems reviewed and negative except as noted in the HPI.   Past Surgical History:  Procedure Laterality Date  . ABDOMINAL HYSTERECTOMY    . CHOLECYSTECTOMY    .  CYSTECTOMY     Right hand  . GASTROSTOMY TUBE PLACEMENT    . HEMICOLECTOMY     Right  . HERNIA REPAIR    . MASTECTOMY     Right, secondary to breast cancer  . PEG PLACEMENT N/A 02/22/2015   Procedure: PERCUTANEOUS ENDOSCOPIC GASTROSTOMY (PEG) REPLACEMENT;  Surgeon: Danie Binder, MD;  Location[facility];  Service: Endoscopy;  Laterality: N/A;  . PEG PLACEMENT N/A 04/29/2016   Procedure: PERCUTANEOUS ENDOSCOPIC GASTROSTOMY (PEG) PLACEMENT;  Surgeon: Danie Binder, MD;  Location[facility];  Service: Endoscopy;  Laterality: N/A;  1200     No family history on file.   Social History   Social History  . Marital status: Single    Spouse name: N/A  . Number of children: N/A  . Years of education: N/A   Occupational History  . Retired    Social History Main Topics  . Smoking status: Unknown If Ever Smoked  . Smokeless tobacco: Never Used  . Alcohol use No  . Drug use: No  . Sexual activity: No   Other Topics Concern  . Not on file   Social History Narrative  Widowed   Lives with daughter   No regular exercise     BP 120/64   Pulse 76   Ht 5\' 3"  (1.6 m)   Wt 168 lb (76.2 kg)   SpO2 99%   BMI 29.76 kg/m   Physical Exam:  Chronically ill, wheel chair bound, elderly woman appearing in NAD, not speaking HEENT: Unremarkable Neck:  6 cm JVD, no thyromegally Back:  No CVA tenderness Lungs:  Clear with no wheezes HEART:  Regular rate rhythm, no murmurs, no rubs, no clicks Abd:  soft, positive bowel sounds, no organomegally, no rebound, no guarding Ext:  2 plus pulses, no edema, no cyanosis, no clubbing Skin:  No rashes no nodules Neuro:  Unable to follow commands.   DEVICE  Normal device function.  See PaceArt for details.   Assess/Plan: 1. Complete heart block - she is s/p ppm insertion 2. Dementia - she does not speak and is fed by G-tube 3. PPM - her [employer] DDD PM is working normally. She is about a half year from ERI. I would not expect to  place a new device when she reaches ERI.  Mikle Bosworth.D.

## 2016-05-12 NOTE — Patient Instructions (Signed)
Your physician wants you to follow-up in: 1 Year with Dr. Taylor. You will receive a reminder letter in the mail two months in advance. If you don't receive a letter, please call our office to schedule the follow-up appointment.  Your physician recommends that you continue on your current medications as directed. Please refer to the Current Medication list given to you today.  If you need a refill on your cardiac medications before your next appointment, please call your pharmacy.  Thank you for choosing [facility]!    

## 2016-05-16 LAB — CUP PACEART INCLINIC DEVICE CHECK
Battery Remaining Longevity: 6 mo
Brady Statistic RV Percent Paced: 100 %
Implantable Lead Implant Date: 20000529
Implantable Lead Implant Date: 20000529
Implantable Lead Location: 753859
Implantable Lead Location: 753860
Lead Channel Pacing Threshold Amplitude: 0.8 V
Lead Channel Pacing Threshold Amplitude: 1 V
Lead Channel Pacing Threshold Pulse Width: 0.3 ms
MDC IDC MSMT LEADCHNL RA SENSING INTR AMPL: 0.15 mV
MDC IDC MSMT LEADCHNL RV PACING THRESHOLD PULSEWIDTH: 0.3 ms
MDC IDC SESS DTM: 20171027135745
MDC IDC STAT BRADY RA PERCENT PACED: 2 %
Pulse Gen Model: 1297
Pulse Gen Serial Number: 318799

## 2016-05-26 ENCOUNTER — Encounter: Payer: Self-pay | Admitting: Internal Medicine

## 2016-05-26 DIAGNOSIS — I442 Atrioventricular block, complete: Secondary | ICD-10-CM | POA: Diagnosis not present

## 2016-08-25 ENCOUNTER — Encounter: Payer: Self-pay | Admitting: Internal Medicine

## 2016-11-24 DIAGNOSIS — I442 Atrioventricular block, complete: Secondary | ICD-10-CM | POA: Diagnosis not present

## 2017-02-19 ENCOUNTER — Other Ambulatory Visit: Payer: Self-pay

## 2017-02-19 ENCOUNTER — Ambulatory Visit (HOSPITAL_COMMUNITY)
Admission: AD | Admit: 2017-02-19 | Discharge: 2017-02-19 | Disposition: A | Discharge status: Home / Self Care | Payer: Medicare Other | Source: Ambulatory Visit | Attending: Gastroenterology | Admitting: Gastroenterology

## 2017-02-19 ENCOUNTER — Telehealth: Payer: Self-pay

## 2017-02-19 ENCOUNTER — Encounter (HOSPITAL_COMMUNITY)
Admission: AD | Disposition: A | Discharge status: Home / Self Care | Payer: Self-pay | Source: Ambulatory Visit | Attending: Gastroenterology

## 2017-02-19 DIAGNOSIS — Z431 Encounter for attention to gastrostomy: Secondary | ICD-10-CM

## 2017-02-19 DIAGNOSIS — R1312 Dysphagia, oropharyngeal phase: Secondary | ICD-10-CM

## 2017-02-19 DIAGNOSIS — K9423 Gastrostomy malfunction: Secondary | ICD-10-CM | POA: Diagnosis present

## 2017-02-19 HISTORY — PX: PEG PLACEMENT: SHX5437

## 2017-02-19 SURGERY — REPLACEMENT, PEG TUBE, WITHOUT ENDOSCOPY
Anesthesia: LOCAL

## 2017-02-19 NOTE — Telephone Encounter (Signed)
Called nursing home and Estill Bamberg is aware and said they will take care of it.

## 2017-02-19 NOTE — Telephone Encounter (Signed)
REVIEWED-NO ADDITIONAL RECOMMENDATIONS. 

## 2017-02-19 NOTE — OR Nursing (Signed)
20 fr peg replacement performed per Dr. Oneida Alar. Patient tolerated procedure without incidence. Orders sent to [name] via [facility]. Report called to Charma Igo LPN.

## 2017-02-19 NOTE — Telephone Encounter (Signed)
Nursing home called to see about getting her Peg Tube replaced because it is leaking.She had it replaced back in Oct. 2017. Please advise

## 2017-02-19 NOTE — Telephone Encounter (Signed)
SEND PT TO ENDO VIA EMS FOR PEG REPLACEMENT BEFORE 230 PM.

## 2017-02-19 NOTE — Telephone Encounter (Signed)
Sandy Haring Loftis NP called from the facility. She had several questions because she was told that we wanted the pt to go to the ED to have peg changed. She informed me that the pt had a foley cath in and it was flushing fine and she checked for placement and that was also fine. She said it is missing the correct cap on the end to keep it from leaking and the pt does have some skin irritation around the site that she felt like needed to be looked at by an MD. I informed her that [facility] wanted the pt brought to endo to have a peg change not the ED. She said that was fine and she would have her brought over.

## 2017-02-20 NOTE — Procedures (Signed)
  PRE-OPERATIVE DIAGNOSIS:  peg tube MALFUNCTION  POST-OPERATIVE DIAGNOSIS:  PEG TUBE REPLACEMENT  PROCEDURE:  Procedure(s): PERCUTANEOUS ENDOSCOPIC GASTROSTOMY (PEG) REPLACEMENT  SURGEON:  Surgeon(s): Dorothyann Peng, MD  PLAN OF CARE:  Resume tube feeds   Procedure technique: Pt  prepped and draped. MEDS REVIEWED. PEG tube FELL OUT. FOLEY PLACED. 10 cc tube aspirated FROM FOLEY. FOLEY removed. Tube replaced with [place]. 6 CC STERILE WATER INJECTED IN BALLOON. SKIN AT 4 CM. TOP OF THE BUMPER AT 6 CM.  Pt tolerated procedure well.  \

## 2017-02-24 ENCOUNTER — Encounter (HOSPITAL_COMMUNITY): Payer: Self-pay | Admitting: Gastroenterology

## 2017-04-29 ENCOUNTER — Emergency Department (HOSPITAL_COMMUNITY): Payer: Medicare Other

## 2017-04-29 ENCOUNTER — Inpatient Hospital Stay (HOSPITAL_COMMUNITY)
Admission: EM | Admit: 2017-04-29 | Discharge: 2017-04-30 | DRG: 872 | Disposition: A | Discharge status: Hospice | Payer: Medicare Other | Attending: Family Medicine | Admitting: Family Medicine

## 2017-04-29 ENCOUNTER — Encounter (HOSPITAL_COMMUNITY): Payer: Self-pay | Admitting: Emergency Medicine

## 2017-04-29 DIAGNOSIS — R6520 Severe sepsis without septic shock: Secondary | ICD-10-CM | POA: Diagnosis present

## 2017-04-29 DIAGNOSIS — Z931 Gastrostomy status: Secondary | ICD-10-CM

## 2017-04-29 DIAGNOSIS — Z79899 Other long term (current) drug therapy: Secondary | ICD-10-CM

## 2017-04-29 DIAGNOSIS — E872 Acidosis, unspecified: Secondary | ICD-10-CM | POA: Diagnosis present

## 2017-04-29 DIAGNOSIS — Z66 Do not resuscitate: Secondary | ICD-10-CM | POA: Diagnosis present

## 2017-04-29 DIAGNOSIS — Z886 Allergy status to analgesic agent status: Secondary | ICD-10-CM | POA: Diagnosis not present

## 2017-04-29 DIAGNOSIS — M1990 Unspecified osteoarthritis, unspecified site: Secondary | ICD-10-CM | POA: Diagnosis present

## 2017-04-29 DIAGNOSIS — N179 Acute kidney failure, unspecified: Secondary | ICD-10-CM | POA: Diagnosis present

## 2017-04-29 DIAGNOSIS — Z882 Allergy status to sulfonamides status: Secondary | ICD-10-CM | POA: Diagnosis not present

## 2017-04-29 DIAGNOSIS — K219 Gastro-esophageal reflux disease without esophagitis: Secondary | ICD-10-CM | POA: Diagnosis present

## 2017-04-29 DIAGNOSIS — E870 Hyperosmolality and hypernatremia: Secondary | ICD-10-CM | POA: Diagnosis present

## 2017-04-29 DIAGNOSIS — G20 Parkinson's disease: Secondary | ICD-10-CM | POA: Diagnosis present

## 2017-04-29 DIAGNOSIS — D696 Thrombocytopenia, unspecified: Secondary | ICD-10-CM | POA: Diagnosis present

## 2017-04-29 DIAGNOSIS — Z7901 Long term (current) use of anticoagulants: Secondary | ICD-10-CM | POA: Diagnosis not present

## 2017-04-29 DIAGNOSIS — Z880 Allergy status to penicillin: Secondary | ICD-10-CM | POA: Diagnosis not present

## 2017-04-29 DIAGNOSIS — Z515 Encounter for palliative care: Secondary | ICD-10-CM

## 2017-04-29 DIAGNOSIS — Z86711 Personal history of pulmonary embolism: Secondary | ICD-10-CM

## 2017-04-29 DIAGNOSIS — I959 Hypotension, unspecified: Secondary | ICD-10-CM | POA: Diagnosis present

## 2017-04-29 DIAGNOSIS — E876 Hypokalemia: Secondary | ICD-10-CM | POA: Diagnosis present

## 2017-04-29 DIAGNOSIS — E860 Dehydration: Secondary | ICD-10-CM | POA: Diagnosis present

## 2017-04-29 DIAGNOSIS — R1310 Dysphagia, unspecified: Secondary | ICD-10-CM

## 2017-04-29 DIAGNOSIS — A419 Sepsis, unspecified organism: Principal | ICD-10-CM

## 2017-04-29 DIAGNOSIS — Z7189 Other specified counseling: Secondary | ICD-10-CM

## 2017-04-29 DIAGNOSIS — I2510 Atherosclerotic heart disease of native coronary artery without angina pectoris: Secondary | ICD-10-CM | POA: Diagnosis present

## 2017-04-29 DIAGNOSIS — F329 Major depressive disorder, single episode, unspecified: Secondary | ICD-10-CM | POA: Diagnosis present

## 2017-04-29 LAB — COMPREHENSIVE METABOLIC PANEL
ALK PHOS: 72 U/L (ref 38–126)
ALT: 15 U/L (ref 14–54)
AST: 17 U/L (ref 15–41)
Albumin: 2.2 g/dL — ABNORMAL LOW (ref 3.5–5.0)
BUN: 120 mg/dL — ABNORMAL HIGH (ref 6–20)
CO2: 8 mmol/L — ABNORMAL LOW (ref 22–32)
Calcium: <4 mg/dL — CL (ref 8.9–10.3)
Chloride: >130 mmol/L (ref 101–111)
Creatinine, Ser: 2.18 mg/dL — ABNORMAL HIGH (ref 0.44–1.00)
GFR calc non Af Amer: 18 mL/min — ABNORMAL LOW (ref >60)
GFR, EST AFRICAN AMERICAN: 21 mL/min — AB (ref >60)
GLUCOSE: 297 mg/dL — AB (ref 65–99)
SODIUM: 158 mmol/L — AB (ref 135–145)
TOTAL PROTEIN: 6.3 g/dL — AB (ref 6.5–8.1)
Total Bilirubin: 0.5 mg/dL (ref 0.3–1.2)

## 2017-04-29 LAB — CBC WITH DIFFERENTIAL/PLATELET
BASOS ABS: 0 10*3/uL (ref 0.0–0.1)
Basophils Relative: 0 %
EOS PCT: 0 %
Eosinophils Absolute: 0 10*3/uL (ref 0.0–0.7)
HCT: 40.9 % (ref 36.0–46.0)
HEMOGLOBIN: 10.8 g/dL — AB (ref 12.0–15.0)
LYMPHS PCT: 9 %
Lymphs Abs: 0.8 10*3/uL (ref 0.7–4.0)
MCH: 28.3 pg (ref 26.0–34.0)
MCHC: 26.4 g/dL — AB (ref 30.0–36.0)
MCV: 107.1 fL — ABNORMAL HIGH (ref 78.0–100.0)
MONOS PCT: 4 %
Monocytes Absolute: 0.3 10*3/uL (ref 0.1–1.0)
NEUTROS PCT: 87 %
Neutro Abs: 7.9 10*3/uL — ABNORMAL HIGH (ref 1.7–7.7)
PLATELETS: 128 10*3/uL — AB (ref 150–400)
RBC: 3.82 MIL/uL — ABNORMAL LOW (ref 3.87–5.11)
RDW: 17.6 % — AB (ref 11.5–15.5)
WBC: 9 10*3/uL (ref 4.0–10.5)

## 2017-04-29 LAB — INFLUENZA PANEL BY PCR (TYPE A & B)
INFLAPCR: NEGATIVE
INFLBPCR: NEGATIVE

## 2017-04-29 LAB — MRSA PCR SCREENING: MRSA by PCR: NEGATIVE

## 2017-04-29 LAB — TROPONIN I: TROPONIN I: 0.05 ng/mL — AB (ref <0.03)

## 2017-04-29 LAB — I-STAT CG4 LACTIC ACID, ED: Lactic Acid, Venous: 3.92 mmol/L (ref 0.5–1.9)

## 2017-04-29 MED ORDER — ONDANSETRON HCL 4 MG/2ML IJ SOLN
4.0000 mg | Freq: Four times a day (QID) | INTRAMUSCULAR | Status: DC | PRN
Start: 2017-04-29 — End: 2017-05-01

## 2017-04-29 MED ORDER — APIXABAN 2.5 MG PO TABS: 2.5000 mg | ORAL_TABLET | Freq: Two times a day (BID) | ORAL | Status: DC

## 2017-04-29 MED ORDER — INSULIN ASPART 100 UNIT/ML ~~LOC~~ SOLN: 0.0000 [IU] | Freq: Four times a day (QID) | SUBCUTANEOUS | Status: DC

## 2017-04-29 MED ORDER — SODIUM CHLORIDE 0.45 % IV SOLN
INTRAVENOUS | Status: DC
  Administered 2017-04-29: 15:00:00 via INTRAVENOUS

## 2017-04-29 MED ORDER — FLEET ENEMA 7-19 GM/118ML RE ENEM: 1.0000 | ENEMA | Freq: Once | RECTAL | Status: DC | PRN

## 2017-04-29 MED ORDER — BISACODYL 10 MG RE SUPP: 10.0000 mg | Freq: Every day | RECTAL | Status: DC | PRN

## 2017-04-29 MED ORDER — LEVOFLOXACIN IN D5W 750 MG/150ML IV SOLN
750.0000 mg | Freq: Once | INTRAVENOUS | Status: AC
  Administered 2017-04-29: 750 mg via INTRAVENOUS
  Filled 2017-04-29: qty 150

## 2017-04-29 MED ORDER — DEXTROSE 5 % IV SOLN
2.0000 g | Freq: Once | INTRAVENOUS | Status: AC
  Administered 2017-04-29: 2 g via INTRAVENOUS
  Filled 2017-04-29: qty 2

## 2017-04-29 MED ORDER — SODIUM CHLORIDE 0.9 % IV SOLN
2.0000 g | Freq: Once | INTRAVENOUS | Status: AC
  Administered 2017-04-29: 2 g via INTRAVENOUS
  Filled 2017-04-29: qty 20

## 2017-04-29 MED ORDER — POTASSIUM CL IN DEXTROSE 5% 20 MEQ/L IV SOLN: 20.0000 meq | INTRAVENOUS | Status: DC

## 2017-04-29 MED ORDER — MORPHINE SULFATE (PF) 2 MG/ML IV SOLN: 0.5000 mg | INTRAVENOUS | Status: DC | PRN

## 2017-04-29 MED ORDER — SODIUM CHLORIDE 0.9 % IV BOLUS (SEPSIS)
1000.0000 mL | Freq: Once | INTRAVENOUS | Status: AC
  Administered 2017-04-29: 1000 mL via INTRAVENOUS

## 2017-04-29 MED ORDER — POLYVINYL ALCOHOL 1.4 % OP SOLN: 2.0000 [drp] | OPHTHALMIC | Status: DC | PRN

## 2017-04-29 MED ORDER — LORAZEPAM 2 MG/ML IJ SOLN: 0.5000 mg | Freq: Four times a day (QID) | INTRAMUSCULAR | Status: DC | PRN

## 2017-04-29 MED ORDER — VANCOMYCIN HCL IN DEXTROSE 1-5 GM/200ML-% IV SOLN: 1000.0000 mg | INTRAVENOUS | Status: DC

## 2017-04-29 MED ORDER — POTASSIUM CL IN DEXTROSE 5% 20 MEQ/L IV SOLN
20.0000 meq | INTRAVENOUS | Status: DC
  Filled 2017-04-29: qty 1000

## 2017-04-29 MED ORDER — ONDANSETRON HCL 4 MG PO TABS: 4.0000 mg | ORAL_TABLET | Freq: Four times a day (QID) | ORAL | Status: DC | PRN

## 2017-04-29 MED ORDER — PRAVASTATIN SODIUM 20 MG PO TABS
20.0000 mg | ORAL_TABLET | Freq: Every day | ORAL | Status: DC
  Filled 2017-04-29: qty 1

## 2017-04-29 MED ORDER — PANTOPRAZOLE SODIUM 40 MG PO TBEC: 40.0000 mg | DELAYED_RELEASE_TABLET | Freq: Every day | ORAL | Status: DC

## 2017-04-29 MED ORDER — ACETAMINOPHEN 325 MG PO TABS: 650.0000 mg | ORAL_TABLET | Freq: Four times a day (QID) | ORAL | Status: DC | PRN

## 2017-04-29 MED ORDER — ATROPINE SULFATE 1 % OP SOLN
2.0000 [drp] | Freq: Three times a day (TID) | OPHTHALMIC | Status: DC
  Administered 2017-04-29 – 2017-05-01 (×4): 2 [drp] via SUBLINGUAL
  Filled 2017-04-29: qty 2

## 2017-04-29 MED ORDER — DEXTROSE 5 % IV SOLN
1.0000 g | Freq: Three times a day (TID) | INTRAVENOUS | Status: DC
  Filled 2017-04-29 (×3): qty 1

## 2017-04-29 MED ORDER — PAROXETINE HCL 10 MG PO TABS
10.0000 mg | ORAL_TABLET | Freq: Every day | ORAL | Status: DC
  Filled 2017-04-29 (×2): qty 1

## 2017-04-29 MED ORDER — VANCOMYCIN HCL IN DEXTROSE 1-5 GM/200ML-% IV SOLN
1000.0000 mg | Freq: Once | INTRAVENOUS | Status: AC
  Administered 2017-04-29: 1000 mg via INTRAVENOUS
  Filled 2017-04-29: qty 200

## 2017-04-29 MED ORDER — ACETAMINOPHEN 650 MG RE SUPP: 650.0000 mg | Freq: Four times a day (QID) | RECTAL | Status: DC | PRN

## 2017-04-29 MED ORDER — POTASSIUM CL IN DEXTROSE 5% 20 MEQ/L IV SOLN
20.0000 meq | INTRAVENOUS | Status: DC
  Filled 2017-04-29 (×4): qty 1000

## 2017-04-29 MED ORDER — LEVOFLOXACIN IN D5W 500 MG/100ML IV SOLN: 500.0000 mg | INTRAVENOUS | Status: DC

## 2017-04-29 MED ORDER — POTASSIUM CHLORIDE 10 MEQ/100ML IV SOLN
10.0000 meq | INTRAVENOUS | Status: DC
  Administered 2017-04-29: 10 meq via INTRAVENOUS
  Filled 2017-04-29: qty 100

## 2017-04-29 MED ORDER — SODIUM CHLORIDE 0.9 % IV BOLUS (SEPSIS)
500.0000 mL | Freq: Once | INTRAVENOUS | Status: AC
  Administered 2017-04-29: 500 mL via INTRAVENOUS

## 2017-04-29 NOTE — H&P (Signed)
History and Physical  [name] YJE:[id number] DOB: 27-Aug-1916 DOA: 04/29/2017  Referring physician: Tomi Bamberger PCP: Jani Gravel, MD   Chief Complaint: Altered Mentation  HPI: Sandy Schmitt is a 83 y.o. female who is chronically debilitated, nothing by mouth with the PEG tube has been in place for several years, social services provides guardianship for patient presented from the [facility] today when patient was found to be unconscious and unresponsive and healthcare providers could not get her blood pressure. Patient was seen in the emergency department and found to be hypotensive and severely septic. The patient was unresponsive.  The patient was also noted to be febrile with a temperature of 103. ED course: Patient was resuscitated with IV fluid hydration and broad-spectrum antibiotics were obtained. Blood cultures were obtained. The patient was noted to have severe electrolyte abnormalities that weren't life-threatening including severe hypokalemia, hypocalcemia, severe sepsis with an elevated lactic acid greater than 3.9, severe dehydration and hypotension, tachypnea and noted to be critically ill.  The ED was able to confirm the DNR order and was able to speak with the patient's guardian.  They were in agreement with IVFs and IV antibiotics but did not desire central line or pressors.    Review of Systems: UTO as patient is unresponsive.  Past Medical History:  Diagnosis Date  . Acid reflux   . Anorexia   . CAD (coronary artery disease)   . Cancer ([place])   . Chest pain   . Contracted, joint   . Depression   . Dysphagia   . Fatigue   . G tube feedings ([place])   . Hypertension   . Insomnia   . Malaise   . Osteoarthritis   . Osteoarthritis   . Paralysis agitans ([place])   . PE (pulmonary embolism)   . Pressure ulcer   . Weakness generalized    Past Surgical History:  Procedure Laterality Date  . ABDOMINAL HYSTERECTOMY    . CHOLECYSTECTOMY    . CYSTECTOMY     Right hand  .  GASTROSTOMY TUBE PLACEMENT    . HEMICOLECTOMY     Right  . HERNIA REPAIR    . MASTECTOMY     Right, secondary to breast cancer  . PEG PLACEMENT N/A 02/22/2015   Procedure: PERCUTANEOUS ENDOSCOPIC GASTROSTOMY (PEG) REPLACEMENT;  Surgeon: Danie Binder, MD;  [facility];  Service: Endoscopy;  Laterality: N/A;  . PEG PLACEMENT N/A 04/29/2016   Procedure: PERCUTANEOUS ENDOSCOPIC GASTROSTOMY (PEG) PLACEMENT;  Surgeon: Danie Binder, MD;  [facility];  Service: Endoscopy;  Laterality: N/A;  1200  . PEG PLACEMENT N/A 02/19/2017   Procedure: PERCUTANEOUS ENDOSCOPIC GASTROSTOMY (PEG) REPLACEMENT;  Surgeon: Danie Binder, MD;  [facility];  Service: Endoscopy;  Laterality: N/A;  100   Social History:  has an unknown smoking status. She has never used smokeless tobacco. She reports that she does not drink alcohol or use drugs.  Allergies  Allergen Reactions  . Aspirin   . Penicillins   . Septra [Sulfamethoxazole-Trimethoprim]     History reviewed. No pertinent family history.  Prior to Admission medications   Medication Sig Start Date End Date Taking? Authorizing Provider  acetaminophen (TYLENOL) 100 MG/ML solution Take 500 mg by mouth every 8 (eight) hours. 10 ML every 8 hours   Yes [provider]  apixaban (ELIQUIS) 5 MG TABS tablet Take 5 mg by mouth 2 (two) times daily.   Yes [provider]  carbidopa-levodopa (SINEMET) 25-100 MG per tablet Take 1 tablet by mouth 3 (three) times daily.   Yes [provider]  clonazePAM (KLONOPIN) 0.5 MG tablet Take 0.25 mg by mouth 2 (two) times daily as needed for anxiety.   Yes [provider]  feeding supplement (PRO-STAT SUGAR FREE 64) LIQD Take 30 mLs by mouth 3 (three) times daily with meals.    Yes [provider]  Nutritional Supplements (ISOSOURCE 1.5 CAL) LIQD 1.5 mL/hr by PEG Tube route.   Yes [provider]  omeprazole (PRILOSEC) 20 MG capsule Take 20 mg  by mouth daily.   Yes [provider]  PARoxetine (PAXIL) 10 MG tablet Take 10 mg by mouth daily.   Yes [provider]  pravastatin (PRAVACHOL) 20 MG tablet Take 20 mg by mouth at bedtime.   Yes [provider]  traMADol (ULTRAM) 50 MG tablet Take 50 mg by mouth every 6 (six) hours as needed.    Yes [provider]  Vitamin D, Ergocalciferol, (DRISDOL) 50000 UNITS CAPS Take 50,000 Units by mouth every 7 (seven) days. Reported on 11/07/2015   Yes [provider]   Physical Exam: Vitals:   04/29/17 1133 04/29/17 1330 04/29/17 1344 04/29/17 1430  BP: (!) 95/58 (!) 83/42  (!) 82/46  Pulse: 97 84  78  Resp: (!) 22 (!) 34  (!) 38  Temp: (!) 102.9 F (39.4 C)     TempSrc: Rectal     SpO2: 100% 100%  100%  Weight:      Height:   5\' 5"  (1.651 m)       General exam: Pt appears critically ill, unresponsive, tachypneic and in moderate distress.    Head, eyes and ENT: Nontraumatic and normocephalic. Pupils dilated. Oral mucosa dry.   Neck: Supple.   Lymphatics: No lymphadenopathy.  Respiratory system: Moderately increased work of breathing with tachypnea, coarse anterior upper airway noise heard.  Cardiovascular system: S1 and S2 heard.   Gastrointestinal system: Abdomen is nondistended, soft and nontender. Normal bowel sounds heard. No organomegaly or masses appreciated.  Central nervous system: Unresponsive with UE contractures.   Skin: Fungal rash noted both upper arms and axillae.  Psychiatry: Unable to assess.   Labs on Admission:  Basic Metabolic Panel:  Recent Labs Lab 04/29/17 1258  NA 158*  K <2.0*  CL >130*  CO2 8*  GLUCOSE 297*  BUN 120*  CREATININE 2.18*  CALCIUM <4.0*   Liver Function Tests:  Recent Labs Lab 04/29/17 1258  AST 17  ALT 15  ALKPHOS 72  BILITOT 0.5  PROT 6.3*  ALBUMIN 2.2*   No results for input(s): LIPASE, AMYLASE in the last 168 hours. No results for input(s): AMMONIA in the last 168  hours. CBC:  Recent Labs Lab 04/29/17 1258  WBC 9.0  NEUTROABS 7.9*  HGB 10.8*  HCT 40.9  MCV 107.1*  PLT 128*   Cardiac Enzymes:  Recent Labs Lab 04/29/17 1258  TROPONINI 0.05*    BNP (last 3 results) No results for input(s): PROBNP in the last 8760 hours. CBG: No results for input(s): GLUCAP in the last 168 hours.  Radiological Exams on Admission: Dg Chest Port 1 View  Result Date: 04/29/2017 CLINICAL DATA:  Altered mental status, history of pulmonary embolism, coronary artery disease. EXAM: PORTABLE CHEST 1 VIEW COMPARISON:  Chest x-ray of March 22, 2011 FINDINGS: The lungs are mildly hypoinflated. The interstitial markings are coarse. The retrocardiac lung markings are coarse but stable. There is partial  obscuration of the left hemidiaphragm which is stable. The cardiac silhouette is mildly enlarged but stable. The pulmonary vascularity is not engorged. The ICD is in stable position. There is calcification in the wall of the thoracic aorta. There are degenerative changes of the left shoulder. IMPRESSION: Limited study due to hypoinflation. Chronic cardiomegaly with left basilar atelectasis or scarring and small left pleural effusion. No acute pneumonia nor CHF. Thoracic aortic atherosclerosis. Electronically Signed   By: David  Martinique M.D.   On: 04/29/2017 12:05   EKG: Independently reviewed.   Assessment/Plan Principal Problem:   Severe sepsis (HCC) Active Problems:   Hypocalcemia   Severe dehydration   Hypotension   Metabolic acidosis   Thrombocytopenia (HCC)   Coronary atherosclerosis   Dysphagia   Hypernatremia   AKI (acute kidney injury) ([place])  1. Severe Sepsis - source of infection undetermined at this time.  Initiated broad spectrum antibiotics and aggressive IVF hydration with no response or improvement in condition.  I spoke with her guardian and explained that patient is actively dying and the decision was made to pursue full comfort care.  I asked the  palliative medicine team to assist with comfort care orders.   2. Hypotension - treated with IVF hydration with no significant improvement, now as above will transition patient to full comfort care.  Anticipating a hospital death.   3. Severe dehydration - Hydrating with IVFs.  4. Metabolic acidosis - severe. 5. Hypernatremia - secondary to severe dehydration.  6. AKI - suspect is prerenal.  7. Severe hypocalcemia - IV calcium was ordered and being given but no significant improvement in condition, and now pursuing a palliative care comfort care treatment plan.    Code Status: DNR  Family Communication: by telephone (daughter)  Disposition Plan: anticipate hospital death   Time spent: 55 mins  Irwin Brakeman, MD [facility] Pager [phone]  If 7PM-7AM, please contact night-coverage [email or web address] Password [email or web address] 04/29/2017, 3:12 PM

## 2017-04-29 NOTE — Consult Note (Signed)
Consultation Note Date: 04/29/2017   Patient Name: Sandy Schmitt  DOB: [birth year]  MRN: [id number]  Age / Sex: 83 y.o., female  PCP: Jani Gravel, MD Referring Physician: Murlean Iba, MD  Reason for Consultation: Establishing goals of care and Psychosocial/spiritual support  HPI/Patient Profile: 83 y.o. female  with past medical history of bedbound, contracted,anorexia, osteoarthritis, SNF resident, Gtube feedings, admitted on 04/29/2017 with severe sepsis.   Clinical Assessment and Goals of Care: Mr. Sommerfeld is lying quietly on a  Stretcher she does not open her eyes, or try to interact with me in any way.   Conference with hospitalist regarding care.  Conference with DSS social worker regarding care Decision is made for full comfort care. Orders written.   Healthcare power of attorney LEGAL GUARDIAN - DSS social worker, Maurie Boettcher   SUMMARY OF RECOMMENDATIONS   Full comfort care  Code Status/Advance Care Planning:  DNR  Symptom Management:   Morphine for pain  Palliative Prophylaxis:   Frequent Pain Assessment and Turn Reposition  Additional Recommendations (Limitations, Scope, Preferences):  Full Comfort Care  Psycho-social/Spiritual:   Desire for further Chaplaincy support:no  Additional Recommendations: Caregiving  Support/Resources  Prognosis:   Hours - Days  Discharge Planning: Anticipated Hospital Death      Primary Diagnoses: Present on Admission: . Severe sepsis ([place]) . Hypocalcemia . Severe dehydration . Hypernatremia . Coronary atherosclerosis . AKI (acute kidney injury) ([place]) . Hypotension . Thrombocytopenia ([place]) . Metabolic acidosis   I have reviewed the medical record, interviewed the patient and family, and examined the patient. The following aspects are pertinent.  Past Medical History:  Diagnosis Date  . Acid reflux   . Anorexia     . CAD (coronary artery disease)   . Cancer ([place])   . Chest pain   . Contracted, joint   . Depression   . Dysphagia   . Fatigue   . G tube feedings ([place])   . Hypertension   . Insomnia   . Malaise   . Osteoarthritis   . Osteoarthritis   . Paralysis agitans ([place])   . PE (pulmonary embolism)   . Pressure ulcer   . Weakness generalized    Social History   Social History  . Marital status: Single    Spouse name: N/A  . Number of children: N/A  . Years of education: N/A   Occupational History  . Retired    Social History Main Topics  . Smoking status: Unknown If Ever Smoked  . Smokeless tobacco: Never Used  . Alcohol use No  . Drug use: No  . Sexual activity: No   Other Topics Concern  . None   Social History Narrative   Widowed   Lives with daughter   No regular exercise   History reviewed. No pertinent family history. Scheduled Meds: Continuous Infusions: . aztreonam    . calcium gluconate 2 g (04/29/17 1546)  . dextrose 5 % with KCl 20 mEq / L    . [START ON 05/01/2017] levofloxacin (  LEVAQUIN) IV    . potassium chloride 10 mEq (04/29/17 1438)  . [START ON 05/01/2017] vancomycin     PRN Meds:.acetaminophen **OR** acetaminophen, bisacodyl, morphine injection, ondansetron **OR** ondansetron (ZOFRAN) IV Medications Prior to Admission:  Prior to Admission medications   Medication Sig Start Date End Date Taking? Authorizing Provider  acetaminophen (TYLENOL) 100 MG/ML solution Take 500 mg by mouth every 8 (eight) hours. 10 ML every 8 hours   Yes [provider]  apixaban (ELIQUIS) 5 MG TABS tablet Take 5 mg by mouth 2 (two) times daily.   Yes [provider]  carbidopa-levodopa (SINEMET) 25-100 MG per tablet Take 1 tablet by mouth 3 (three) times daily.   Yes [provider]  clonazePAM (KLONOPIN) 0.5 MG tablet Take 0.25 mg by mouth 2 (two) times daily as needed for anxiety.   Yes [provider]  feeding supplement (PRO-STAT  SUGAR FREE 64) LIQD Take 30 mLs by mouth 3 (three) times daily with meals.    Yes [provider]  Nutritional Supplements (ISOSOURCE 1.5 CAL) LIQD 1.5 mL/hr by PEG Tube route.   Yes [provider]  omeprazole (PRILOSEC) 20 MG capsule Take 20 mg by mouth daily.   Yes [provider]  PARoxetine (PAXIL) 10 MG tablet Take 10 mg by mouth daily.   Yes [provider]  pravastatin (PRAVACHOL) 20 MG tablet Take 20 mg by mouth at bedtime.   Yes [provider]  traMADol (ULTRAM) 50 MG tablet Take 50 mg by mouth every 6 (six) hours as needed.    Yes [provider]  Vitamin D, Ergocalciferol, (DRISDOL) 50000 UNITS CAPS Take 50,000 Units by mouth every 7 (seven) days. Reported on 11/07/2015   Yes [provider]   Allergies  Allergen Reactions  . Aspirin   . Penicillins   . Septra [Sulfamethoxazole-Trimethoprim]    Review of Systems  Unable to perform ROS: Acuity of condition    Physical Exam  Constitutional: She appears distressed.  frail  Cardiovascular: Normal rate.   Pulmonary/Chest:  Work of breathing  Abdominal: Soft.  Musculoskeletal: She exhibits deformity.  contractures  Neurological:  Un responsive  Skin: Skin is dry.  Nursing note and vitals reviewed.   Vital Signs: BP (!) 76/38   Pulse 74   Temp (!) 102.9 F (39.4 C) (Rectal)   Resp (!) 33   Ht 5\' 5"  (1.651 m)   Wt 76.2 kg (168 lb)   SpO2 100%   BMI 27.96 kg/m          SpO2: SpO2: 100 % O2 Device:SpO2: 100 % O2 Flow Rate: .O2 Flow Rate (L/min): 4 L/min  IO: Intake/output summary: No intake or output data in the 24 hours ending 04/29/17 1643  LBM:   Baseline Weight: Weight: 76.2 kg (168 lb) Most recent weight: Weight: 76.2 kg (168 lb)     Palliative Assessment/Data:   Flowsheet Rows     Most Recent Value  Intake Tab  Referral Department  Hospitalist  Unit at Time of Referral  Med/Surg Unit  Palliative Care Primary Diagnosis   Sepsis/Infectious Disease  Date Notified  04/29/17  Palliative Care Type  New Palliative care  Reason for referral  Clarify Goals of Care, End of Life Care Assistance  Date of Admission  04/29/17  Date first seen by Palliative Care  04/29/17  # of days Palliative referral response time  0 Day(s)  # of days IP prior to Palliative referral  0  Clinical Assessment  Palliative Performance Scale Score  10%  Pain Max last 24 hours  Not able to report  Pain Min Last 24 hours  Not able to report  Dyspnea Max Last 24 Hours  Not able to report  Dyspnea Min Last 24 hours  Not able to report  Psychosocial & Spiritual Assessment  Palliative Care Outcomes  Patient/Family meeting held?  Yes  Who was at the meeting?  DSS SW via phone  Palliative Care Outcomes  Provided end of life care assistance, Clarified goals of care, Changed to focus on comfort  Patient/Family wishes: Interventions discontinued/not started   Mechanical Ventilation      Time In: 1530 Time Out: 1620 Time Total: 50 minutes Greater than 50%  of this time was spent counseling and coordinating care related to the above assessment and plan.  Signed by: Drue Novel, NP   Please contact Palliative Medicine Team phone at [phone] for questions and concerns.  For individual provider: See Shea Evans

## 2017-04-29 NOTE — ED Notes (Signed)
Date and time results received: 04/29/17 1345 (use smartphrase ".now" to insert current time)  Test: troponin Critical Value: 0.05  Name of Provider Notified: Dr. Tomi Bamberger at 1346  Orders Received? Or Actions Taken?: no/na

## 2017-04-29 NOTE — Progress Notes (Signed)
Pharmacy Note:  Initial antibiotics for Vancomycin / Aztreonam / Levaquin ordered by EDP for Sepsis.  CrCl cannot be calculated (No order found.).   Allergies  Allergen Reactions  . Aspirin   . Penicillins   . Septra [Sulfamethoxazole-Trimethoprim]     Vitals:   04/29/17 1127 04/29/17 1133  BP:  (!) 95/58  Pulse:  97  Resp:  (!) 22  Temp:  (!) 102.9 F (39.4 C)  SpO2: 100% 100%    Anti-infectives    Start     Dose/Rate Route Frequency Ordered Stop   04/29/17 1215  levofloxacin (LEVAQUIN) IVPB 750 mg     750 mg 100 mL/hr over 90 Minutes Intravenous  Once 04/29/17 1208     04/29/17 1215  aztreonam (AZACTAM) 2 g in dextrose 5 % 50 mL IVPB     2 g 100 mL/hr over 30 Minutes Intravenous  Once 04/29/17 1208     04/29/17 1215  vancomycin (VANCOCIN) IVPB 1000 mg/200 mL premix     1,000 mg 200 mL/hr over 60 Minutes Intravenous  Once 04/29/17 1208       Plan: Initial doses of Vancomycin / Aztreonam / Levaquin X 1 ordered. F/U admission orders for further dosing if therapy continued.  Pricilla Larsson, [facility] 04/29/2017 12:20 PM

## 2017-04-29 NOTE — ED Notes (Signed)
Attempted to notify facility of pt care plan. No answer noted at [name].

## 2017-04-29 NOTE — Progress Notes (Addendum)
Pharmacy Note:  Initial antibiotics for Vancomycin / Aztreonam / Levaquin ordered by EDP for Sepsis.  Estimated Creatinine Clearance: 15.1 mL/min (A) (by C-G formula based on SCr of 2.18 mg/dL (H)).   Allergies  Allergen Reactions  . Aspirin   . Penicillins   . Septra [Sulfamethoxazole-Trimethoprim]     Vitals:   04/29/17 1330 04/29/17 1430  BP: (!) 83/42 (!) 82/46  Pulse: 84 78  Resp: (!) 34 (!) 38  Temp:    SpO2: 100% 100%   Antimicrobials this admission:  Vanc 10/10 >>   Aztreonam >>  Levaquin >>  Dose adjustments this admission:  Estimated Creatinine Clearance: 15.1 mL/min (A) (by C-G formula based on SCr of 2.18 mg/dL (H)).   Microbiology results:  10/10 BCx:   UCx:    Sputum:    MRSA PCR:    Plan: ABX continued on admission. Levaquin 500mg  IV every 48 hours. Aztreonam 1gm IV every 8 hours. Vancomycin 1gm IV every 48 hours. Goal Trough 15-20. Monitor labs, micro and vitals.   Pricilla Larsson, [facility] 04/29/2017 3:14 PM

## 2017-04-29 NOTE — ED Notes (Signed)
Date and time results received: 04/29/17 1345 (use smartphrase ".now" to insert current time)  Test: potassium Critical Value: less than 2  Name of Provider Notified: Dr. Tomi Bamberger informed at 1346  Orders Received? Or Actions Taken?: no/na

## 2017-04-29 NOTE — ED Notes (Signed)
Date and time results received: 04/29/17 1345 (use smartphrase ".now" to insert current time)  Test: Chloride Critical Value: 145  Name of Provider Notified: Dr. Tomi Bamberger at 1346  Orders Received? Or Actions Taken?: no/na

## 2017-04-29 NOTE — ED Triage Notes (Signed)
Per EMS pt is from [place] ([name]) pt was unconscious and unresponsive and healthcare providers could not get a blood pressure.  Pt is a DNR.    BP 70/44 CBG high NSR on monitor O2 saturation: 99% IV established 22 G LT hand (NACL bolus)

## 2017-04-29 NOTE — ED Notes (Signed)
CRITICAL VALUE ALERT  Critical Value:  Calcium <4  Date & Time Notied:  04/29/2017 1359  Provider Notified: Tomi Bamberger  Orders Received/Actions taken: No order received at this time

## 2017-04-29 NOTE — ED Provider Notes (Signed)
[facility] DEPT Provider Note   CSN: [id number] Arrival date & time: 04/29/17  1112   LEvel 5 caveat: altered mental status, pt not speaking  History   Chief Complaint Chief Complaint  Patient presents with  . Altered Mental Status    HPI COLLINS DIMARIA is a 83 y.o. female.  HPI Pt is unable to provide any history.  Per the nursing notes, pt was unconscious and unresponsive at the nursing home.  They could not get a blood pressure.  EMS was called.  In the ED pt was noted to be febrile, temp 103.  Pt is DNR.   Past Medical History:  Diagnosis Date  . Acid reflux   . Anorexia   . CAD (coronary artery disease)   . Cancer ([place])   . Chest pain   . Contracted, joint   . Depression   . Dysphagia   . Fatigue   . G tube feedings ([place])   . Hypertension   . Insomnia   . Malaise   . Osteoarthritis   . Osteoarthritis   . Paralysis agitans ([place])   . PE (pulmonary embolism)   . Pressure ulcer   . Weakness generalized     Patient Active Problem List   Diagnosis Date Noted  . Pacemaker 11/15/2014  . Dysphagia 07/11/2014  . Dislodged gastrostomy tube ([place])   . Fracture, femur, supracondylar ([place]) 11/29/2012  . Coronary atherosclerosis 02/05/2009  . CHEST PAIN 02/05/2009    Past Surgical History:  Procedure Laterality Date  . ABDOMINAL HYSTERECTOMY    . CHOLECYSTECTOMY    . CYSTECTOMY     Right hand  . GASTROSTOMY TUBE PLACEMENT    . HEMICOLECTOMY     Right  . HERNIA REPAIR    . MASTECTOMY     Right, secondary to breast cancer  . PEG PLACEMENT N/A 02/22/2015   Procedure: PERCUTANEOUS ENDOSCOPIC GASTROSTOMY (PEG) REPLACEMENT;  Surgeon: Danie Binder, MD;  Location: [facility];  Service: Endoscopy;  Laterality: N/A;  . PEG PLACEMENT N/A 04/29/2016   Procedure: PERCUTANEOUS ENDOSCOPIC GASTROSTOMY (PEG) PLACEMENT;  Surgeon: Danie Binder, MD;  Location: [facility];  Service: Endoscopy;  Laterality: N/A;  1200  . PEG PLACEMENT N/A 02/19/2017   Procedure:  PERCUTANEOUS ENDOSCOPIC GASTROSTOMY (PEG) REPLACEMENT;  Surgeon: Danie Binder, MD;  Location: [facility];  Service: Endoscopy;  Laterality: N/A;  100    OB History    No data available       Home Medications    Prior to Admission medications   Medication Sig Start Date End Date Taking? Authorizing Provider  acetaminophen (TYLENOL) 100 MG/ML solution Take 500 mg by mouth every 8 (eight) hours. 10 ML every 8 hours   Yes [provider]  apixaban (ELIQUIS) 5 MG TABS tablet Take 5 mg by mouth 2 (two) times daily.   Yes [provider]  carbidopa-levodopa (SINEMET) 25-100 MG per tablet Take 1 tablet by mouth 3 (three) times daily.   Yes [provider]  clonazePAM (KLONOPIN) 0.5 MG tablet Take 0.25 mg by mouth 2 (two) times daily as needed for anxiety.   Yes [provider]  feeding supplement (PRO-STAT SUGAR FREE 64) LIQD Take 30 mLs by mouth 3 (three) times daily with meals.    Yes [provider]  Nutritional Supplements (ISOSOURCE 1.5 CAL) LIQD 1.5 mL/hr by PEG Tube route.   Yes [provider]  omeprazole (PRILOSEC) 20 MG capsule Take 20 mg by mouth daily.  Yes [provider]  PARoxetine (PAXIL) 10 MG tablet Take 10 mg by mouth daily.   Yes [provider]  pravastatin (PRAVACHOL) 20 MG tablet Take 20 mg by mouth at bedtime.   Yes [provider]  traMADol (ULTRAM) 50 MG tablet Take 50 mg by mouth every 6 (six) hours as needed.    Yes [provider]  Vitamin D, Ergocalciferol, (DRISDOL) 50000 UNITS CAPS Take 50,000 Units by mouth every 7 (seven) days. Reported on 11/07/2015   Yes [provider]    Family History No family history on file.  Social History Social History  Substance Use Topics  . Smoking status: Unknown If Ever Smoked  . Smokeless tobacco: Never Used  . Alcohol use No     Allergies   Aspirin; Penicillins; and Septra  [sulfamethoxazole-trimethoprim]   Review of Systems Review of Systems  Unable to perform ROS: Mental status change     Physical Exam Updated Vital Signs BP (!) 83/42   Pulse 84   Temp (!) 102.9 F (39.4 C) (Rectal)   Resp (!) 34   Ht 1.651 m (5\' 5" )   Wt 76.2 kg (168 lb)   SpO2 100%   BMI 27.96 kg/m   Physical Exam  Constitutional: She appears distressed.  Frail   HENT:  Head: Normocephalic and atraumatic.  Right Ear: External ear normal.  Left Ear: External ear normal.  Mouth/Throat: No oropharyngeal exudate.  Eyes: Conjunctivae are normal. Right eye exhibits no discharge. Left eye exhibits no discharge. No scleral icterus.  Neck: Neck supple. No tracheal deviation present.  Cardiovascular: Regular rhythm and intact distal pulses.  Tachycardia present.   Pulmonary/Chest: Accessory muscle usage present. No stridor. Tachypnea noted. She has no wheezes. She has rhonchi. She has no rales.  Abdominal: Soft. Bowel sounds are normal. She exhibits no distension. There is no tenderness. There is no rebound and no guarding.  Genitourinary:  Genitourinary Comments: No skin lesions, no rash noted  Musculoskeletal: She exhibits no edema or tenderness.  Warm, well perfused, no cyanosis  Neurological: She is alert. She displays atrophy. She displays no seizure activity.  Unresponsive, no response to painful or verbal stimuli,   Skin: Skin is warm and dry. Rash noted.  Rash in skin folds  Psychiatric: She has a normal mood and affect.  Nursing note and vitals reviewed.    ED Treatments / Results  Labs (all labs ordered are listed, but only abnormal results are displayed) Labs Reviewed  CBC WITH DIFFERENTIAL/PLATELET - Abnormal; Notable for the following:       Result Value   RBC 3.82 (*)    Hemoglobin 10.8 (*)    MCV 107.1 (*)    MCHC 26.4 (*)    RDW 17.6 (*)    Platelets 128 (*)    Neutro Abs 7.9 (*)    All other components within normal limits  COMPREHENSIVE  METABOLIC PANEL - Abnormal; Notable for the following:    Sodium 158 (*)    Potassium <2.0 (*)    Chloride >130 (*)    CO2 8 (*)    Glucose, Bld 297 (*)    BUN 120 (*)    Creatinine, Ser 2.18 (*)    Calcium <4.0 (*)    Total Protein 6.3 (*)    Albumin 2.2 (*)    GFR calc non Af Amer 18 (*)    GFR calc Af Amer 21 (*)    All other components within normal limits  TROPONIN  I - Abnormal; Notable for the following:    Troponin I 0.05 (*)    All other components within normal limits  I-STAT CG4 LACTIC ACID, ED - Abnormal; Notable for the following:    Lactic Acid, Venous 3.92 (*)    All other components within normal limits  CULTURE, BLOOD (ROUTINE X 2)  CULTURE, BLOOD (ROUTINE X 2)  URINE CULTURE  URINALYSIS, ROUTINE W REFLEX MICROSCOPIC  INFLUENZA PANEL BY PCR (TYPE A & B)  I-STAT CG4 LACTIC ACID, ED    EKG  EKG Interpretation  Date/Time:  Wednesday April 29 2017 11:21:59 EDT Ventricular Rate:  96 PR Interval:    QRS Duration: 116 QT Interval:  384 QTC Calculation: 486 R Axis:   111 Text Interpretation:  Sinus rhythm Nonspecific intraventricular conduction delay Low voltage, precordial leads Repol abnrm suggests ischemia, anterolateral Confirmed by Nat Christen [phone]) on 04/29/2017 11:40:32 AM       Radiology Dg Chest Port 1 View  Result Date: 04/29/2017 CLINICAL DATA:  Altered mental status, history of pulmonary embolism, coronary artery disease. EXAM: PORTABLE CHEST 1 VIEW COMPARISON:  Chest x-ray of March 22, 2011 FINDINGS: The lungs are mildly hypoinflated. The interstitial markings are coarse. The retrocardiac lung markings are coarse but stable. There is partial obscuration of the left hemidiaphragm which is stable. The cardiac silhouette is mildly enlarged but stable. The pulmonary vascularity is not engorged. The ICD is in stable position. There is calcification in the wall of the thoracic aorta. There are degenerative changes of the left shoulder. IMPRESSION:  Limited study due to hypoinflation. Chronic cardiomegaly with left basilar atelectasis or scarring and small left pleural effusion. No acute pneumonia nor CHF. Thoracic aortic atherosclerosis. Electronically Signed   By: David  Martinique M.D.   On: 04/29/2017 12:05    Procedures .Critical Care Performed by: Dorie Rank Authorized by: Dorie Rank   Critical care provider statement:    Critical care time (minutes):  45   Critical care was necessary to treat or prevent imminent or life-threatening deterioration of the following conditions:  Metabolic crisis, sepsis, renal failure and dehydration   Critical care was time spent personally by me on the following activities:  Discussions with consultants, evaluation of patient's response to treatment, examination of patient, ordering and performing treatments and interventions, ordering and review of laboratory studies, ordering and review of radiographic studies, pulse oximetry, re-evaluation of patient's condition, obtaining history from patient or surrogate and review of old charts   (including critical care time)  Medications Ordered in ED Medications  potassium chloride 10 mEq in 100 mL IVPB (10 mEq Intravenous New Bag/Given 04/29/17 1438)  0.45 % sodium chloride infusion (not administered)  sodium chloride 0.9 % bolus 1,000 mL (1,000 mLs Intravenous New Bag/Given 04/29/17 1159)  sodium chloride 0.9 % bolus 1,000 mL (0 mLs Intravenous Stopped 04/29/17 1408)    And  sodium chloride 0.9 % bolus 1,000 mL (0 mLs Intravenous Stopped 04/29/17 1310)    And  sodium chloride 0.9 % bolus 500 mL (0 mLs Intravenous Stopped 04/29/17 1258)  levofloxacin (LEVAQUIN) IVPB 750 mg (0 mg Intravenous Stopped 04/29/17 1408)  aztreonam (AZACTAM) 2 g in dextrose 5 % 50 mL IVPB (0 g Intravenous Stopped 04/29/17 1310)  vancomycin (VANCOCIN) IVPB 1000 mg/200 mL premix (0 mg Intravenous Stopped 04/29/17 1438)     Initial Impression / Assessment and Plan / ED Course  I  have reviewed the triage vital signs and the nursing notes.  Pertinent labs & imaging  results that were available during my care of the patient were reviewed by me and considered in my medical decision making (see chart for details).  Clinical Course as of Apr 29 1437  Wed Apr 29, 2017  1215 Pt presents with hypotensive and febrile.  Appears septic.  DNR status.  Will continue with fluids, abx, medical treatment.  [KZ]  9935 Pt has a MOST form.  Her power of attorney is [employer] work.  Left a message with the number.  Will continue to monitor.  Hold off on aggressive measures like intubation or central lines right now.  [TS]  1779 I updated the patient's family of her status. They understand the severity of her illness. They confirm that the patient is DO NOT RESUSCITATE, DO NOT INTUBATE.They do agree with fluids and antibiotics.  [JK]    Clinical Course User Index [JK] Dorie Rank, MD    Patient presented to the emergency room for altered mental status. She appears to have sepsis.   Patient is altered and her blood pressures have been low. Electrolytes shows several abnormalities including hypokalemia, hypernatremia, acute kidney injury associated with metabolic acidosis and hyperchloremia.  Patient has been given IV fluids. We will continue hydration and antibiotics.  The patient's prognosis is poor however considering her age and her presenting state. We will continue with antibiotics and fluid resuscitation. Family is in agreement with her current care. I also did speak with her social worker who is apparently the power of attorney.  There are available to sign any consent forms if necessary.   Final Clinical Impressions(s) / ED Diagnoses   Final diagnoses:  Sepsis, due to unspecified organism ([place])  AKI (acute kidney injury) ([place])  Hypokalemia  Hypernatremia  Metabolic acidosis      Dorie Rank, MD 04/29/17 1438

## 2017-04-30 ENCOUNTER — Inpatient Hospital Stay (HOSPITAL_COMMUNITY)
Admission: RE | Admit: 2017-04-30 | Discharge: 2017-05-21 | DRG: 871 | Disposition: E | Source: Ambulatory Visit | Attending: Family Medicine | Admitting: Family Medicine

## 2017-04-30 DIAGNOSIS — Z931 Gastrostomy status: Secondary | ICD-10-CM

## 2017-04-30 DIAGNOSIS — N189 Chronic kidney disease, unspecified: Secondary | ICD-10-CM | POA: Diagnosis present

## 2017-04-30 DIAGNOSIS — R6521 Severe sepsis with septic shock: Secondary | ICD-10-CM | POA: Diagnosis present

## 2017-04-30 DIAGNOSIS — E872 Acidosis: Secondary | ICD-10-CM | POA: Diagnosis present

## 2017-04-30 DIAGNOSIS — Z886 Allergy status to analgesic agent status: Secondary | ICD-10-CM

## 2017-04-30 DIAGNOSIS — E860 Dehydration: Secondary | ICD-10-CM | POA: Diagnosis present

## 2017-04-30 DIAGNOSIS — Z86711 Personal history of pulmonary embolism: Secondary | ICD-10-CM

## 2017-04-30 DIAGNOSIS — Z881 Allergy status to other antibiotic agents status: Secondary | ICD-10-CM | POA: Diagnosis not present

## 2017-04-30 DIAGNOSIS — I129 Hypertensive chronic kidney disease with stage 1 through stage 4 chronic kidney disease, or unspecified chronic kidney disease: Secondary | ICD-10-CM | POA: Diagnosis present

## 2017-04-30 DIAGNOSIS — K219 Gastro-esophageal reflux disease without esophagitis: Secondary | ICD-10-CM | POA: Diagnosis present

## 2017-04-30 DIAGNOSIS — M1990 Unspecified osteoarthritis, unspecified site: Secondary | ICD-10-CM | POA: Diagnosis present

## 2017-04-30 DIAGNOSIS — I2510 Atherosclerotic heart disease of native coronary artery without angina pectoris: Secondary | ICD-10-CM | POA: Diagnosis present

## 2017-04-30 DIAGNOSIS — Z66 Do not resuscitate: Secondary | ICD-10-CM | POA: Diagnosis present

## 2017-04-30 DIAGNOSIS — G20 Parkinson's disease: Secondary | ICD-10-CM | POA: Diagnosis present

## 2017-04-30 DIAGNOSIS — Z7189 Other specified counseling: Secondary | ICD-10-CM

## 2017-04-30 DIAGNOSIS — Z515 Encounter for palliative care: Secondary | ICD-10-CM | POA: Diagnosis present

## 2017-04-30 DIAGNOSIS — E870 Hyperosmolality and hypernatremia: Secondary | ICD-10-CM | POA: Diagnosis not present

## 2017-04-30 DIAGNOSIS — E876 Hypokalemia: Secondary | ICD-10-CM | POA: Diagnosis present

## 2017-04-30 DIAGNOSIS — R6520 Severe sepsis without septic shock: Secondary | ICD-10-CM

## 2017-04-30 DIAGNOSIS — F329 Major depressive disorder, single episode, unspecified: Secondary | ICD-10-CM | POA: Diagnosis present

## 2017-04-30 DIAGNOSIS — N179 Acute kidney failure, unspecified: Secondary | ICD-10-CM | POA: Diagnosis not present

## 2017-04-30 DIAGNOSIS — Z880 Allergy status to penicillin: Secondary | ICD-10-CM

## 2017-04-30 DIAGNOSIS — R1310 Dysphagia, unspecified: Secondary | ICD-10-CM

## 2017-04-30 DIAGNOSIS — A419 Sepsis, unspecified organism: Principal | ICD-10-CM | POA: Diagnosis present

## 2017-04-30 MED ORDER — LORAZEPAM 1 MG PO TABS
1.0000 mg | ORAL_TABLET | Freq: Four times a day (QID) | ORAL | Status: DC | PRN
  Administered 2017-04-30: 1 mg via SUBLINGUAL
  Filled 2017-04-30: qty 1

## 2017-04-30 MED ORDER — MORPHINE SULFATE (CONCENTRATE) 10 MG/0.5ML PO SOLN
2.5000 mg | ORAL | Status: DC
  Administered 2017-04-30 – 2017-05-01 (×5): 2.6 mg via SUBLINGUAL
  Filled 2017-04-30 (×5): qty 0.5

## 2017-04-30 MED ORDER — MORPHINE SULFATE (CONCENTRATE) 10 MG/0.5ML PO SOLN
2.5000 mg | ORAL | Status: DC | PRN
  Administered 2017-05-01: 2.6 mg via SUBLINGUAL
  Filled 2017-04-30: qty 0.5

## 2017-04-30 MED ORDER — SODIUM CHLORIDE 0.9 % IV SOLN
INTRAVENOUS | Status: DC
  Administered 2017-04-30: 11:00:00 via INTRAVENOUS

## 2017-04-30 NOTE — Progress Notes (Signed)
Daily Progress Note   Patient Name: Sandy Schmitt       Date: 04/21/2017 DOB: 04-Nov-1916  Age: 83 y.o. MRN#: [id number] Attending Physician: Murlean Iba, MD Primary Care Physician: Jani Gravel, MD Admit Date: 04/29/2017  Reason for Consultation/Follow-up: Establishing goals of care, Inpatient hospice referral and Psychosocial/spiritual support  Subjective: Mrs. Tyer is lying quietly in bed. She does not try to interact with me in any way.She is minimally responsive this morning, but seems relatively comfortable. No family at bedside at this time.  Conference with nursing staff and case management related to symptom management, comfort and dignity, and disposition to hospice home.  Conference with DSS social worker, [employer] crew. She is agreeable to transition to hospice home of [facility] for comfort and dignity at end-of-life. We also discuss scheduling to morphine for pain and breathlessness, she agrees.  Conference with hospitalist related to symptom management, disposition to hospice home .  DSS social work is requesting hospice home of [facility], but no beds at this time, likely GIP. Anticipated Hospital death.  Length of Stay: 1  Current Medications: Scheduled Meds:  . atropine  2 drop Sublingual TID  . morphine CONCENTRATE  2.6 mg Sublingual Q4H    Continuous Infusions: . sodium chloride      PRN Meds: acetaminophen **OR** acetaminophen, bisacodyl, LORazepam, morphine CONCENTRATE, ondansetron **OR** ondansetron (ZOFRAN) IV, polyvinyl alcohol  Physical Exam  Constitutional: No distress.  Does not open eyes to voice, minimal response  HENT:  Head: Atraumatic.  Temporal wasting  Cardiovascular: Normal rate and regular rhythm.   Pulmonary/Chest:  Effort normal. No respiratory distress.  Increased respiratory rate to 30s at times  Abdominal: Soft. She exhibits no distension.  Musculoskeletal: She exhibits deformity. She exhibits no edema.  Contractures, muscle wasting  Neurological:  Does not open eyes to voice or touch, minimal responsiveness  Skin: Skin is warm and dry.  Nursing note and vitals reviewed.           Vital Signs: BP (!) 95/45 (BP Location: Left Arm)   Pulse 76   Temp (!) 97.4 F (36.3 C) (Axillary)   Resp (!) 31   Ht 5\' 5"  (1.651 m)   Wt 76.2 kg (168 lb)   SpO2 100%   BMI 27.96 kg/m  SpO2: SpO2: 100 % O2 Device:  O2 Device: Nasal Cannula O2 Flow Rate: O2 Flow Rate (L/min): 2 L/min  Intake/output summary:  Intake/Output Summary (Last 24 hours) at 04/23/2017 1250 Last data filed at 05/04/2017 1027  Gross per 24 hour  Intake                0 ml  Output             1200 ml  Net            -1200 ml   LBM:   Baseline Weight: Weight: 76.2 kg (168 lb) Most recent weight: Weight: 76.2 kg (168 lb)       Palliative Assessment/Data:    Flowsheet Rows     Most Recent Value  Intake Tab  Referral Department  Hospitalist  Unit at Time of Referral  Med/Surg Unit  Palliative Care Primary Diagnosis  Sepsis/Infectious Disease  Date Notified  04/29/17  Palliative Care Type  New Palliative care  Reason for referral  Clarify Goals of Care, End of Life Care Assistance  Date of Admission  04/29/17  Date first seen by Palliative Care  04/29/17  # of days Palliative referral response time  0 Day(s)  # of days IP prior to Palliative referral  0  Clinical Assessment  Palliative Performance Scale Score  10%  Pain Max last 24 hours  Not able to report  Pain Min Last 24 hours  Not able to report  Dyspnea Max Last 24 Hours  Not able to report  Dyspnea Min Last 24 hours  Not able to report  Psychosocial & Spiritual Assessment  Palliative Care Outcomes  Patient/Family meeting held?  Yes  Who was at the meeting?  DSS SW  via phone  Palliative Care Outcomes  Provided end of life care assistance, Clarified goals of care, Changed to focus on comfort  Patient/Family wishes: Interventions discontinued/not started   Mechanical Ventilation      Patient Active Problem List   Diagnosis Date Noted  . Severe sepsis ([place]) 04/29/2017  . Hypocalcemia 04/29/2017  . Severe dehydration 04/29/2017  . Hypernatremia 04/29/2017  . AKI (acute kidney injury) ([place]) 04/29/2017  . Hypotension 04/29/2017  . Thrombocytopenia ([place]) 04/29/2017  . Metabolic acidosis 27/12/2374  . Sepsis ([place])   . Palliative care encounter   . Goals of care, counseling/discussion   . Pacemaker 11/15/2014  . Dysphagia 07/11/2014  . Dislodged gastrostomy tube ([place])   . Fracture, femur, supracondylar ([place]) 11/29/2012  . Coronary atherosclerosis 02/05/2009  . CHEST PAIN 02/05/2009    Palliative Care Assessment & Plan   Patient Profile: 83 y.o. female  with past medical history of bedbound, contracted,anorexia, osteoarthritis, SNF resident, Gtube feedings, admitted on 04/29/2017 with severe sepsis.   Assessment: Severe sepsis; DSS social worker has decided to focus on comfort and dignity at end-of-life. Requesting placement in hospice home of [facility].  Recommendations/Plan:  Full comfort care.  Requesting hospice home placement with [facility].  Goals of Care and Additional Recommendations:  Limitations on Scope of Treatment: Full Comfort Care  Code Status:    Code Status Orders        Start     Ordered   04/29/17 2831  Do not attempt resuscitation (DNR)  Continuous    Question Answer Comment  In the event of cardiac or respiratory ARREST Do not call a "code blue"   In the event of cardiac or respiratory ARREST Do not perform Intubation, CPR, defibrillation or ACLS   In the event of  cardiac or respiratory ARREST Use medication by any route, position, wound care, and other measures to relive pain and suffering.  May use oxygen, suction and manual treatment of airway obstruction as needed for comfort.      04/29/17 1515    Code Status History    Date Active Date Inactive Code Status Order ID Comments User Context   04/29/2017 12:16 PM 04/29/2017  3:15 PM DNR [id number]  Dorie Rank, MD ED    Advance Directive Documentation     Most Recent Value  Type of Advance Directive  Healthcare Power of Attorney  Pre-existing out of facility DNR order (yellow form or pink MOST form)  -  "MOST" Form in Place?  -       Prognosis:   < 2 weeks  Discharge Planning:  requesting hospice home of [facility], but no beds at this time, likely GIP.  Care plan was discussed with nursing staff, case manager, social worker, DSS social worker [place], and Dr. Wynetta Emery.  Thank you for allowing the Palliative Medicine Team to assist in the care of this patient.   Time In: 0940 Time Out: 1020 Total Time 40 minutes Prolonged Time Billed  no       Greater than 50%  of this time was spent counseling and coordinating care related to the above assessment and plan.  Drue Novel, NP  Please contact Palliative Medicine Team phone at [phone] for questions and concerns.

## 2017-04-30 NOTE — Clinical Social Work Note (Signed)
Clinical Social Work Assessment  Patient Details  Name: Sandy Schmitt MRN: [id number] Date of Birth: 1917-07-10  Date of referral:  05/01/2017               Reason for consult:  Discharge Planning                Permission sought to share information with:    Permission granted to share information::     Name::        Agency::  Vicente Males, guardian, [facility] DSS; Ricard Dillon at [place]  Relationship::     Contact Information:     Housing/Transportation Living arrangements for the past 2 months:  [place] of Information:  Facility, Other (Comment Required) Vicente Males, [place] guardian. ) Patient Interpreter Needed:  None, Other (Comment Required) (Patient does not communicate. ) Criminal Activity/Legal Involvement Pertinent to Current Situation/Hospitalization:  No - Comment as needed Significant Relationships:  Other(Comment) (Ward of the state. ) Lives with:  Facility Resident Do you feel safe going back to the place where you live?  Yes Need for family participation in patient care:  Yes (Comment)  Care giving concerns:  None identified. Facility resident.    Social Worker assessment / plan:  Patient has been a resident at [facility] since 2013. She is long term, total care, bed bound and does not communicate.  Per Jackelyn Poling, at [place], she can return at discharge.  Vicente Males, guardian with [facility] DSS, advised that patinet is a ward of the state. She is aware of patient's current condition and has been speaking with palliative care. She advised that patient has a pre need planned funeral with Miguel Rota in [place] [phone]).     Employment status:  Retired Nurse, adult PT Recommendations:  Not assessed at this time Information / Referral to community resources:     Patient/Family's Response to care: Guardian is agreeable to [facility].   Patient/Family's Understanding of and Emotional Response to Diagnosis, Current Treatment, and  Prognosis:  Guardian understands patient's diagnosis, treatment and prognosis and as a result are agreeable to Hospice involvement.   Emotional Assessment Appearance:    Attitude/Demeanor/Rapport:  Unable to Assess Affect (typically observed):  Unable to Assess Orientation:    Alcohol / Substance use:  Not Applicable Psych involvement (Current and /or in the community):  No (Comment)  Discharge Needs  Concerns to be addressed:  No discharge needs identified Readmission within the last 30 days:  No Current discharge risk:  Chronically ill Barriers to Discharge:  No Barriers Identified   Ihor Gully, LCSW 04/24/2017, 12:01 PM

## 2017-04-30 NOTE — Progress Notes (Signed)
PROGRESS NOTE   Sandy Schmitt  KXF:[id number]  DOB: August 19, 1916  DOA: 04/29/2017 PCP: Jani Gravel, MD  Brief Admission Hx: Sandy Schmitt is a 83 y.o. female who is chronically debilitated, nothing by mouth with the PEG tube has been in place for several years, social services provides guardianship for patient presented from the [facility] SNF today when patient was found to be unconscious and unresponsive and healthcare providers could not get her blood pressure.  MDM/Assessment & Plan:   1. Severe sepsis and multiple electrolyte abnormalities - Pt has been transitioned to full comfort care and comfort care orders have been started.  Anticipating a hospital death.  Morphine ordered for breathlessness and respiratory distress. Lorazepam ordered for anxiety.    Code Status: DNR Disposition Plan: anticipating hospital death   Consultants:  Palliative medicine  Subjective: Pt appears terminal.  Does not appear to be in distress.   Objective: Vitals:   04/29/17 1430 04/29/17 1500 04/29/17 1649 04/29/17 2333  BP: (!) 82/46 (!) 76/38 (!) 97/40 (!) 95/45  Pulse: 78 74 84 76  Resp: (!) 38 (!) 33 (!) 30 (!) 31  Temp:   98.5 F (36.9 C) (!) 97.4 F (36.3 C)  TempSrc:   Oral Axillary  SpO2: 100% 100% 94% 100%  Weight:      Height:        Intake/Output Summary (Last 24 hours) at 05/16/2017 0954 Last data filed at 04/29/17 1800  Gross per 24 hour  Intake                0 ml  Output                0 ml  Net                0 ml   Filed Weights   04/29/17 1127  Weight: 76.2 kg (168 lb)     REVIEW OF SYSTEMS  As per history otherwise all reviewed and reported negative  Exam:  General exam: patient appears terminal.  Respiratory system: coarse upper airway congestion noise.  Cardiovascular system: S1 & S2 heard.  Gastrointestinal system: Abdomen is soft.  Central nervous system: unresponsive Extremities: no cyanosis.   Data Reviewed: Basic Metabolic Panel:  Recent  Labs Lab 04/29/17 1258  NA 158*  K <2.0*  CL >130*  CO2 8*  GLUCOSE 297*  BUN 120*  CREATININE 2.18*  CALCIUM <4.0*   Liver Function Tests:  Recent Labs Lab 04/29/17 1258  AST 17  ALT 15  ALKPHOS 72  BILITOT 0.5  PROT 6.3*  ALBUMIN 2.2*   No results for input(s): LIPASE, AMYLASE in the last 168 hours. No results for input(s): AMMONIA in the last 168 hours. CBC:  Recent Labs Lab 04/29/17 1258  WBC 9.0  NEUTROABS 7.9*  HGB 10.8*  HCT 40.9  MCV 107.1*  PLT 128*   Cardiac Enzymes:  Recent Labs Lab 04/29/17 1258  TROPONINI 0.05*   CBG (last 3)  No results for input(s): GLUCAP in the last 72 hours. Recent Results (from the past 240 hour(s))  Blood Culture (routine x 2)     Status: None (Preliminary result)   Collection Time: 04/29/17 11:46 AM  Result Value Ref Range Status   Specimen Description BLOOD LEFT HAND DRAWN BY RN  Final   Special Requests   Final    BOTTLES DRAWN AEROBIC AND ANAEROBIC Blood Culture adequate volume   Culture NO GROWTH < 24 HOURS  Final  Report Status PENDING  Incomplete  Blood Culture (routine x 2)     Status: None (Preliminary result)   Collection Time: 04/29/17 12:27 PM  Result Value Ref Range Status   Specimen Description BLOOD RIGHT ARM  Final   Special Requests   Final    BOTTLES DRAWN AEROBIC AND ANAEROBIC Blood Culture adequate volume   Culture NO GROWTH < 24 HOURS  Final   Report Status PENDING  Incomplete  MRSA PCR Screening     Status: None   Collection Time: 04/29/17  5:04 PM  Result Value Ref Range Status   MRSA by PCR NEGATIVE NEGATIVE Final    Comment:        The GeneXpert MRSA Assay (FDA approved for NASAL specimens only), is one component of a comprehensive MRSA colonization surveillance program. It is not intended to diagnose MRSA infection nor to guide or monitor treatment for MRSA infections.      Studies: Dg Chest Port 1 View  Result Date: 04/29/2017 CLINICAL DATA:  Altered mental status,  history of pulmonary embolism, coronary artery disease. EXAM: PORTABLE CHEST 1 VIEW COMPARISON:  Chest x-ray of March 22, 2011 FINDINGS: The lungs are mildly hypoinflated. The interstitial markings are coarse. The retrocardiac lung markings are coarse but stable. There is partial obscuration of the left hemidiaphragm which is stable. The cardiac silhouette is mildly enlarged but stable. The pulmonary vascularity is not engorged. The ICD is in stable position. There is calcification in the wall of the thoracic aorta. There are degenerative changes of the left shoulder. IMPRESSION: Limited study due to hypoinflation. Chronic cardiomegaly with left basilar atelectasis or scarring and small left pleural effusion. No acute pneumonia nor CHF. Thoracic aortic atherosclerosis. Electronically Signed   By: David  Martinique M.D.   On: 04/29/2017 12:05     Scheduled Meds: . atropine  2 drop Sublingual TID   Continuous Infusions: . dextrose 5 % with KCl 20 mEq / L      Principal Problem:   Severe sepsis (HCC) Active Problems:   Hypocalcemia   Severe dehydration   Hypotension   Metabolic acidosis   Thrombocytopenia (HCC)   Coronary atherosclerosis   Dysphagia   Hypernatremia   AKI (acute kidney injury) ([place])   Sepsis ([place])   Palliative care encounter   Goals of care, counseling/discussion   Time spent:   Irwin Brakeman, MD, FAAFP [facility] Pager [phone]  If 7PM-7AM, please contact night-coverage [email or web address] Password [email or web address] 05/08/2017, 9:54 AM    LOS: 1 day

## 2017-04-30 NOTE — Clinical Social Work Note (Signed)
Patient Information   Patient Name Leanora, Murin ([id number]) Sex Female DOB Dec 29, 1916 SSN [id number]  Room Bed  A341 A341-01  Patient Demographics   Address [place] [address] Contact Numbers [phone]  Patient Ethnicity & Race   Ethnic Group Patient Race  Not Hispanic or Latino Black or African American  Emergency Contact(s)   Name Relation Home Work Mobile  McCullough,Brenda Daughter [phone]  [phone]  Documents on File    Status Date Received Description  Documents for the Patient  EMR Medication Summary Not Received    EMR Problem Summary Not Received    EMR Patient Summary Not Received    Historic Radiology Documentation Not Received    [place] Not Received    [place]o E-Signature HIPAA Notice of Privacy Received 02/22/15   [place] E-Signature HIPAA Notice of Privacy Spanish Not Received    Driver's License Not Received    Insurance Card Not Received    Advance Directives/Living Will/HCPOA/POA Not Received    Financial Application Not Received    Other Photo ID Not Received    HIM ROI Authorization  03/05/15 [employer]  HIM ROI Authorization  05/07/15 [employer] Medicare Risk Adjustment Review  HIM ROI Authorization (Expired) 12/02/16 Authorization for batch [employer] Medicare Risk Adjustment Review  fbg  12/02/2016  Documents for the Encounter  AOB (Assignment of Insurance Benefits) Not Received    E-signature AOB Unable to Obtain 04/29/17   MEDICARE RIGHTS Not Received    E-signature Medicare Rights Unable to Obtain 04/29/17   ED Patient Billing Extract   ED PB Summary  Admission Information   Attending Provider Admitting Provider Admission Type Admission Date/Time  Murlean Iba, MD Murlean Iba, MD Emergency 04/29/17 1112  Discharge Date Hospital Service Auth/Cert Status [place]  Unit Room/Bed  Admission Status   AP-DEPT 300 A341/A341-01 Admission (Confirmed)   Admission   Complaint  [facility] Account   Name Acct ID Class Status Primary Coverage  Debara, [place] [id number] [place]      Guarantor Account (for [place] [id number])   Name Relation to [place]? Acct Type  [place], [place] Yes Personal/Family  Address Phone    [address] [place], [place] [address] [phone])        Coverage Information (for Hospital Account [id number])   F/O Payor/Plan Precert #  [facility] #  [place], [place] [id number]  Address [place]  [address] [place] [address] [phone]

## 2017-04-30 NOTE — Clinical Social Work Note (Addendum)
Per request of Palliative Care NP a referral was made to [facility].  LCSW made referral to [facility]. No beds are currently available. Patient will have to be GIP.      Seba Madole, Clydene Pugh, LCSW

## 2017-05-01 MED ORDER — BIOTENE DRY MOUTH MT LIQD: 15.0000 mL | OROMUCOSAL | Status: DC | PRN

## 2017-05-01 MED ORDER — POLYVINYL ALCOHOL 1.4 % OP SOLN: 1.0000 [drp] | Freq: Four times a day (QID) | OPHTHALMIC | Status: DC | PRN

## 2017-05-01 MED ORDER — HALOPERIDOL LACTATE 2 MG/ML PO CONC: 0.5000 mg | ORAL | Status: DC | PRN

## 2017-05-01 MED ORDER — SODIUM CHLORIDE 0.9% FLUSH
3.0000 mL | Freq: Two times a day (BID) | INTRAVENOUS | Status: DC
  Administered 2017-05-01 – 2017-05-02 (×4): 3 mL via INTRAVENOUS

## 2017-05-01 MED ORDER — SODIUM CHLORIDE 0.9 % IV SOLN: 250.0000 mL | INTRAVENOUS | Status: DC | PRN

## 2017-05-01 MED ORDER — ONDANSETRON HCL 4 MG PO TABS
4.0000 mg | ORAL_TABLET | Freq: Four times a day (QID) | ORAL | Status: DC | PRN
Start: 2017-05-01 — End: 2017-05-04

## 2017-05-01 MED ORDER — HALOPERIDOL LACTATE 5 MG/ML IJ SOLN: 0.5000 mg | INTRAMUSCULAR | Status: DC | PRN

## 2017-05-01 MED ORDER — ACETAMINOPHEN 325 MG PO TABS: 650.0000 mg | ORAL_TABLET | Freq: Four times a day (QID) | ORAL | Status: DC | PRN

## 2017-05-01 MED ORDER — LORAZEPAM 1 MG PO TABS
1.0000 mg | ORAL_TABLET | Freq: Four times a day (QID) | ORAL | Status: DC | PRN
  Administered 2017-05-01: 1 mg via SUBLINGUAL
  Filled 2017-05-01: qty 1

## 2017-05-01 MED ORDER — MORPHINE SULFATE (CONCENTRATE) 10 MG/0.5ML PO SOLN: 5.0000 mg | ORAL | Status: DC | PRN

## 2017-05-01 MED ORDER — MORPHINE SULFATE (CONCENTRATE) 10 MG/0.5ML PO SOLN
5.0000 mg | ORAL | Status: DC
  Administered 2017-05-01 – 2017-05-03 (×12): 5 mg via SUBLINGUAL
  Filled 2017-05-01 (×12): qty 0.5

## 2017-05-01 MED ORDER — MORPHINE SULFATE (CONCENTRATE) 10 MG/0.5ML PO SOLN
5.0000 mg | ORAL | Status: DC | PRN
  Administered 2017-05-01 (×4): 5 mg via SUBLINGUAL
  Filled 2017-05-01 (×3): qty 0.5

## 2017-05-01 MED ORDER — ONDANSETRON HCL 4 MG/2ML IJ SOLN: 4.0000 mg | Freq: Four times a day (QID) | INTRAMUSCULAR | Status: DC | PRN

## 2017-05-01 MED ORDER — BISACODYL 10 MG RE SUPP: 10.0000 mg | Freq: Every day | RECTAL | Status: DC | PRN

## 2017-05-01 MED ORDER — SODIUM CHLORIDE 0.9% FLUSH: 3.0000 mL | INTRAVENOUS | Status: DC | PRN

## 2017-05-01 MED ORDER — ATROPINE SULFATE 1 % OP SOLN
2.0000 [drp] | Freq: Three times a day (TID) | OPHTHALMIC | Status: DC
  Administered 2017-05-01 – 2017-05-02 (×3): 2 [drp] via SUBLINGUAL
  Filled 2017-05-01: qty 2

## 2017-05-01 MED ORDER — MORPHINE SULFATE (CONCENTRATE) 10 MG/0.5ML PO SOLN: 5.0000 mg | ORAL | Status: DC

## 2017-05-01 MED ORDER — POLYVINYL ALCOHOL 1.4 % OP SOLN: 2.0000 [drp] | OPHTHALMIC | Status: DC | PRN

## 2017-05-01 MED ORDER — ACETAMINOPHEN 650 MG RE SUPP: 650.0000 mg | Freq: Four times a day (QID) | RECTAL | Status: DC | PRN

## 2017-05-01 MED ORDER — HALOPERIDOL 0.5 MG PO TABS: 0.5000 mg | ORAL_TABLET | ORAL | Status: DC | PRN

## 2017-05-01 NOTE — H&P (Signed)
History and Physical  Sandy Schmitt JQB:[id number] DOB: 1917-01-14 DOA: 05/01/2017  PCP: Jani Gravel, MD    HPI: Sandy Schmitt is a 83 y.o. female who was diagnosed with severe sepsis and multiple electrolyte abnormalities and has been transitioned to full comfort care.  Please see h&p from 04/29/17.    Review of Systems: UTO (pt is unresponsive)  Past Medical History:  Diagnosis Date  . Acid reflux   . Anorexia   . CAD (coronary artery disease)   . Cancer ([place])   . Chest pain   . Contracted, joint   . Depression   . Dysphagia   . Fatigue   . G tube feedings ([place])   . Hypertension   . Insomnia   . Malaise   . Osteoarthritis   . Osteoarthritis   . Paralysis agitans ([place])   . PE (pulmonary embolism)   . Pressure ulcer   . Weakness generalized    Past Surgical History:  Procedure Laterality Date  . ABDOMINAL HYSTERECTOMY    . CHOLECYSTECTOMY    . CYSTECTOMY     Right hand  . GASTROSTOMY TUBE PLACEMENT    . HEMICOLECTOMY     Right  . HERNIA REPAIR    . MASTECTOMY     Right, secondary to breast cancer  . PEG PLACEMENT N/A 02/22/2015   Procedure: PERCUTANEOUS ENDOSCOPIC GASTROSTOMY (PEG) REPLACEMENT;  Surgeon: Danie Binder, MD;  Location: [facility] END[facility];  Service: Endoscopy;  Laterality: N/A;  . PEG PLACEMENT N/A 04/29/2016   Procedure: PERCUTANEOUS ENDOSCOPIC GASTROSTOMY (PEG) PLACEMENT;  Surgeon: Danie Binder, MD;  Location: [facility] ENDO SUITE;  Service: Endoscopy;  Laterality: N/A;  1200  . PEG PLACEMENT N/A 02/19/2017   Procedure: PERCUTANEOUS ENDOSCOPIC GASTROSTOMY (PEG) REPLACEMENT;  Surgeon: Danie Binder, MD;  Location: [facility] ENDO SUITE;  Service: Endoscopy;  Laterality: N/A;  100   Social History:  reports that she has never smoked. She has never used smokeless tobacco. She reports that she does not drink alcohol or use drugs.  Allergies  Allergen Reactions  . Aspirin   . Penicillins   . Septra [Sulfamethoxazole-Trimethoprim]     No family history on  file.  Prior to Admission medications   Not on File   Physical Exam: There were no vitals filed for this visit.  General exam: patient appears terminal.  Respiratory system: coarse upper airway congestion noise.  Cardiovascular system: S1 & S2 heard.  Gastrointestinal system: Abdomen is soft.  Central nervous system: unresponsive  Extremities: no cyanosis.   Labs on Admission:  Basic Metabolic Panel:  Recent Labs Lab 04/29/17 1258  NA 158*  K <2.0*  CL >130*  CO2 8*  GLUCOSE 297*  BUN 120*  CREATININE 2.18*  CALCIUM <4.0*   Liver Function Tests:  Recent Labs Lab 04/29/17 1258  AST 17  ALT 15  ALKPHOS 72  BILITOT 0.5  PROT 6.3*  ALBUMIN 2.2*   No results for input(s): LIPASE, AMYLASE in the last 168 hours. No results for input(s): AMMONIA in the last 168 hours. CBC:  Recent Labs Lab 04/29/17 1258  WBC 9.0  NEUTROABS 7.9*  HGB 10.8*  HCT 40.9  MCV 107.1*  PLT 128*   Cardiac Enzymes:  Recent Labs Lab 04/29/17 1258  TROPONINI 0.05*    BNP (last 3 results) No results for input(s): PROBNP in the last 8760 hours. CBG: No results for input(s): GLUCAP in the last 168 hours.  Radiological Exams on Admission: Dg Chest  Port 1 View  Result Date: 04/29/2017 CLINICAL DATA:  Altered mental status, history of pulmonary embolism, coronary artery disease. EXAM: PORTABLE CHEST 1 VIEW COMPARISON:  Chest x-ray of March 22, 2011 FINDINGS: The lungs are mildly hypoinflated. The interstitial markings are coarse. The retrocardiac lung markings are coarse but stable. There is partial obscuration of the left hemidiaphragm which is stable. The cardiac silhouette is mildly enlarged but stable. The pulmonary vascularity is not engorged. The ICD is in stable position. There is calcification in the wall of the thoracic aorta. There are degenerative changes of the left shoulder. IMPRESSION: Limited study due to hypoinflation. Chronic cardiomegaly with left basilar  atelectasis or scarring and small left pleural effusion. No acute pneumonia nor CHF. Thoracic aortic atherosclerosis. Electronically Signed   By: David  Martinique M.D.   On: 04/29/2017 12:05   Assessment/Plan Active Problems:   Severe sepsis ([place])  Pt is terminal and will initiate full comfort care orders.  Appreciate assistance from palliative medicine team.   Irwin Brakeman, MD [facility] Pager [phone]  If 7PM-7AM, please contact night-coverage [email or web address] Password [email or web address] 05/01/2017, 11:51 AM

## 2017-05-01 NOTE — Progress Notes (Signed)
PROGRESS NOTE   Sandy Schmitt  SWN:[id number]  DOB: 03-07-1917  DOA: 04/29/2017 PCP: Jani Gravel, MD  Brief Admission Hx: Sandy Schmitt is a 83 y.o. female who is chronically debilitated, nothing by mouth with the PEG tube has been in place for several years, social services provides guardianship for patient presented from the [facility] SNF today when patient was found to be unconscious and unresponsive and healthcare providers could not get her blood pressure.  MDM/Assessment & Plan:   1. Severe sepsis and multiple electrolyte abnormalities - Pt has been transitioned to full comfort care and comfort care orders have been started.  Morphine ordered for breathlessness and respiratory distress. Lorazepam ordered for anxiety.  Pt does appear much more comfortable this morning.    Code Status: DNR Disposition Plan:    Consultants:  Palliative medicine  Subjective: Pt appears terminal.  Does not appear to be in distress.   Objective: Vitals:   04/29/17 1649 04/29/17 2333 05/18/2017 1435 05/01/17 0450  BP: (!) 97/40 (!) 95/45 (!) 113/51 (!) 87/37  Pulse: 84 76 62 65  Resp: (!) 30 (!) 31 (!) 22 (!) 39  Temp: 98.5 F (36.9 C) (!) 97.4 F (36.3 C) (!) 97.3 F (36.3 C) (!) 97.4 F (36.3 C)  TempSrc: Oral Axillary Axillary Axillary  SpO2: 94% 100% 97% 98%  Weight:      Height:        Intake/Output Summary (Last 24 hours) at 05/01/17 0933 Last data filed at 05/01/17 0457  Gross per 24 hour  Intake                0 ml  Output             1600 ml  Net            -1600 ml   Filed Weights   04/29/17 1127  Weight: 76.2 kg (168 lb)     REVIEW OF SYSTEMS  As per history otherwise all reviewed and reported negative  Exam:  General exam: patient appears terminal.  Respiratory system: coarse upper airway congestion noise.  Cardiovascular system: S1 & S2 heard.  Gastrointestinal system: Abdomen is soft.  Central nervous system: unresponsive Extremities: no cyanosis.   Data  Reviewed: Basic Metabolic Panel:  Recent Labs Lab 04/29/17 1258  NA 158*  K <2.0*  CL >130*  CO2 8*  GLUCOSE 297*  BUN 120*  CREATININE 2.18*  CALCIUM <4.0*   Liver Function Tests:  Recent Labs Lab 04/29/17 1258  AST 17  ALT 15  ALKPHOS 72  BILITOT 0.5  PROT 6.3*  ALBUMIN 2.2*   No results for input(s): LIPASE, AMYLASE in the last 168 hours. No results for input(s): AMMONIA in the last 168 hours. CBC:  Recent Labs Lab 04/29/17 1258  WBC 9.0  NEUTROABS 7.9*  HGB 10.8*  HCT 40.9  MCV 107.1*  PLT 128*   Cardiac Enzymes:  Recent Labs Lab 04/29/17 1258  TROPONINI 0.05*   CBG (last 3)  No results for input(s): GLUCAP in the last 72 hours. Recent Results (from the past 240 hour(s))  Blood Culture (routine x 2)     Status: None (Preliminary result)   Collection Time: 04/29/17 11:46 AM  Result Value Ref Range Status   Specimen Description BLOOD LEFT HAND DRAWN BY RN  Final   Special Requests   Final    BOTTLES DRAWN AEROBIC AND ANAEROBIC Blood Culture adequate volume   Culture NO GROWTH 2 DAYS  Final  Report Status PENDING  Incomplete  Blood Culture (routine x 2)     Status: None (Preliminary result)   Collection Time: 04/29/17 12:27 PM  Result Value Ref Range Status   Specimen Description BLOOD RIGHT ARM  Final   Special Requests   Final    BOTTLES DRAWN AEROBIC AND ANAEROBIC Blood Culture adequate volume   Culture NO GROWTH 2 DAYS  Final   Report Status PENDING  Incomplete  MRSA PCR Screening     Status: None   Collection Time: 04/29/17  5:04 PM  Result Value Ref Range Status   MRSA by PCR NEGATIVE NEGATIVE Final    Comment:        The GeneXpert MRSA Assay (FDA approved for NASAL specimens only), is one component of a comprehensive MRSA colonization surveillance program. It is not intended to diagnose MRSA infection nor to guide or monitor treatment for MRSA infections.      Studies: Dg Chest Port 1 View  Result Date:  04/29/2017 CLINICAL DATA:  Altered mental status, history of pulmonary embolism, coronary artery disease. EXAM: PORTABLE CHEST 1 VIEW COMPARISON:  Chest x-ray of March 22, 2011 FINDINGS: The lungs are mildly hypoinflated. The interstitial markings are coarse. The retrocardiac lung markings are coarse but stable. There is partial obscuration of the left hemidiaphragm which is stable. The cardiac silhouette is mildly enlarged but stable. The pulmonary vascularity is not engorged. The ICD is in stable position. There is calcification in the wall of the thoracic aorta. There are degenerative changes of the left shoulder. IMPRESSION: Limited study due to hypoinflation. Chronic cardiomegaly with left basilar atelectasis or scarring and small left pleural effusion. No acute pneumonia nor CHF. Thoracic aortic atherosclerosis. Electronically Signed   By: David  Martinique M.D.   On: 04/29/2017 12:05     Scheduled Meds: . atropine  2 drop Sublingual TID  . morphine CONCENTRATE  2.6 mg Sublingual Q4H   Continuous Infusions:   Principal Problem:   Severe sepsis (HCC) Active Problems:   Hypocalcemia   Severe dehydration   Hypotension   Metabolic acidosis   Thrombocytopenia (HCC)   Coronary atherosclerosis   Dysphagia   Hypernatremia   AKI (acute kidney injury) ([place])   Sepsis ([place])   Palliative care encounter   Goals of care, counseling/discussion   Encounter for hospice care discussion  Time spent:   Irwin Brakeman, MD, FAAFP [facility] Pager [phone]  If 7PM-7AM, please contact night-coverage [email or web address] Password [email or web address] 05/01/2017, 9:33 AM    LOS: 2 days

## 2017-05-01 NOTE — Discharge Summary (Signed)
Physician Discharge Summary  Sandy Schmitt IEP:[id number] DOB: 12/26/1916 DOA: 04/29/2017  PCP: Jani Gravel, MD  Admit date: 04/29/2017 Discharge date: 05/01/2017  Discharge Condition: Hospice  CODE STATUS: DNR  Brief Hospitalization Summary: Please see all hospital notes, images, labs for full details of the hospitalization.  HPI: Sandy Schmitt is a 83 y.o. female who is chronically debilitated, nothing by mouth with the PEG tube has been in place for several years, social services provides guardianship for patient presented from the [facility] today when patient was found to be unconscious and unresponsive and healthcare providers could not get her blood pressure. Patient was seen in the emergency department and found to be hypotensive and severely septic. The patient was unresponsive.  The patient was also noted to be febrile with a temperature of 103. ED course: Patient was resuscitated with IV fluid hydration and broad-spectrum antibiotics were obtained. Blood cultures were obtained. The patient was noted to have severe electrolyte abnormalities that weren't life-threatening including severe hypokalemia, hypocalcemia, severe sepsis with an elevated lactic acid greater than 3.9, severe dehydration and hypotension, tachypnea and noted to be critically ill.  The ED was able to confirm the DNR order and was able to speak with the patient's guardian.  They were in agreement with IVFs and IV antibiotics but did not desire central line or pressors.  1. Severe sepsis and multiple electrolyte abnormalities - Pt has been transitioned to full comfort care and comfort care orders have been started.  Anticipating a hospital death.  Morphine ordered for breathlessness and respiratory distress. Lorazepam ordered for anxiety.    Code Status: DNR Disposition Plan: anticipating hospital death   Consultants:  Palliative medicine Discharge Diagnoses:  Principal Problem:   Severe sepsis ([place]) Active  Problems:   Hypocalcemia   Severe dehydration   Hypotension   Metabolic acidosis   Thrombocytopenia (HCC)   Coronary atherosclerosis   Dysphagia   Hypernatremia   AKI (acute kidney injury) ([place])   Sepsis ([place])   Palliative care encounter   Goals of care, counseling/discussion   Encounter for hospice care discussion    Discharge Instructions:  Allergies as of 05/01/2017      Reactions   Aspirin    Penicillins    Septra [sulfamethoxazole-trimethoprim]         Allergies  Allergen Reactions  . Aspirin   . Penicillins   . Septra [Sulfamethoxazole-Trimethoprim]    Current Discharge Medication List    STOP taking these medications     acetaminophen (TYLENOL) 100 MG/ML solution      apixaban (ELIQUIS) 5 MG TABS tablet      carbidopa-levodopa (SINEMET) 25-100 MG per tablet      clonazePAM (KLONOPIN) 0.5 MG tablet      feeding supplement (PRO-STAT SUGAR FREE 64) LIQD      Nutritional Supplements (ISOSOURCE 1.5 CAL) LIQD      omeprazole (PRILOSEC) 20 MG capsule      PARoxetine (PAXIL) 10 MG tablet      pravastatin (PRAVACHOL) 20 MG tablet      traMADol (ULTRAM) 50 MG tablet      Vitamin D, Ergocalciferol, (DRISDOL) 50000 UNITS CAPS         Procedures/Studies: Dg Chest Port 1 View  Result Date: 04/29/2017 CLINICAL DATA:  Altered mental status, history of pulmonary embolism, coronary artery disease. EXAM: PORTABLE CHEST 1 VIEW COMPARISON:  Chest x-ray of March 22, 2011 FINDINGS: The lungs are mildly hypoinflated. The interstitial markings are coarse. The retrocardiac lung  markings are coarse but stable. There is partial obscuration of the left hemidiaphragm which is stable. The cardiac silhouette is mildly enlarged but stable. The pulmonary vascularity is not engorged. The ICD is in stable position. There is calcification in the wall of the thoracic aorta. There are degenerative changes of the left shoulder. IMPRESSION: Limited study due to hypoinflation.  Chronic cardiomegaly with left basilar atelectasis or scarring and small left pleural effusion. No acute pneumonia nor CHF. Thoracic aortic atherosclerosis. Electronically Signed   By: David  Martinique M.D.   On: 04/29/2017 12:05      Discharge Exam: Vitals:   05/16/2017 1435 05/01/17 0450  BP: (!) 113/51 (!) 87/37  Pulse: 62 65  Resp: (!) 22 (!) 39  Temp: (!) 97.3 F (36.3 C) (!) 97.4 F (36.3 C)  SpO2: 97% 98%   Vitals:   04/29/17 1649 04/29/17 2333 04/27/2017 1435 05/01/17 0450  BP: (!) 97/40 (!) 95/45 (!) 113/51 (!) 87/37  Pulse: 84 76 62 65  Resp: (!) 30 (!) 31 (!) 22 (!) 39  Temp: 98.5 F (36.9 C) (!) 97.4 F (36.3 C) (!) 97.3 F (36.3 C) (!) 97.4 F (36.3 C)  TempSrc: Oral Axillary Axillary Axillary  SpO2: 94% 100% 97% 98%  Weight:      Height:        The results of significant diagnostics from this hospitalization (including imaging, microbiology, ancillary and laboratory) are listed below for reference.     Microbiology: Recent Results (from the past 240 hour(s))  Blood Culture (routine x 2)     Status: None (Preliminary result)   Collection Time: 04/29/17 11:46 AM  Result Value Ref Range Status   Specimen Description BLOOD LEFT HAND DRAWN BY RN  Final   Special Requests   Final    BOTTLES DRAWN AEROBIC AND ANAEROBIC Blood Culture adequate volume   Culture NO GROWTH 2 DAYS  Final   Report Status PENDING  Incomplete  Blood Culture (routine x 2)     Status: None (Preliminary result)   Collection Time: 04/29/17 12:27 PM  Result Value Ref Range Status   Specimen Description BLOOD RIGHT ARM  Final   Special Requests   Final    BOTTLES DRAWN AEROBIC AND ANAEROBIC Blood Culture adequate volume   Culture NO GROWTH 2 DAYS  Final   Report Status PENDING  Incomplete  MRSA PCR Screening     Status: None   Collection Time: 04/29/17  5:04 PM  Result Value Ref Range Status   MRSA by PCR NEGATIVE NEGATIVE Final    Comment:        The GeneXpert MRSA Assay (FDA approved for  NASAL specimens only), is one component of a comprehensive MRSA colonization surveillance program. It is not intended to diagnose MRSA infection nor to guide or monitor treatment for MRSA infections.      Labs: BNP (last 3 results) No results for input(s): BNP in the last 8760 hours. Basic Metabolic Panel:  Recent Labs Lab 04/29/17 1258  NA 158*  K <2.0*  CL >130*  CO2 8*  GLUCOSE 297*  BUN 120*  CREATININE 2.18*  CALCIUM <4.0*   Liver Function Tests:  Recent Labs Lab 04/29/17 1258  AST 17  ALT 15  ALKPHOS 72  BILITOT 0.5  PROT 6.3*  ALBUMIN 2.2*   No results for input(s): LIPASE, AMYLASE in the last 168 hours. No results for input(s): AMMONIA in the last 168 hours. CBC:  Recent Labs Lab 04/29/17 1258  WBC  9.0  NEUTROABS 7.9*  HGB 10.8*  HCT 40.9  MCV 107.1*  PLT 128*   Cardiac Enzymes:  Recent Labs Lab 04/29/17 1258  TROPONINI 0.05*   BNP: Invalid input(s): POCBNP CBG: No results for input(s): GLUCAP in the last 168 hours. D-Dimer No results for input(s): DDIMER in the last 72 hours. Hgb A1c No results for input(s): HGBA1C in the last 72 hours. Lipid Profile No results for input(s): CHOL, HDL, LDLCALC, TRIG, CHOLHDL, LDLDIRECT in the last 72 hours. Thyroid function studies No results for input(s): TSH, T4TOTAL, T3FREE, THYROIDAB in the last 72 hours.  Invalid input(s): FREET3 Anemia work up No results for input(s): VITAMINB12, FOLATE, FERRITIN, TIBC, IRON, RETICCTPCT in the last 72 hours. Urinalysis No results found for: COLORURINE, APPEARANCEUR, LABSPEC, [place], GLUCOSEU, [place], BILIRUBINUR, [place], PROTEINUR, UROBILINOGEN, NITRITE, LEUKOCYTESUR Sepsis Labs Invalid input(s): PROCALCITONIN,  WBC,  LACTICIDVEN Microbiology Recent Results (from the past 240 hour(s))  Blood Culture (routine x 2)     Status: None (Preliminary result)   Collection Time: 04/29/17 11:46 AM  Result Value Ref Range Status   Specimen Description BLOOD  LEFT HAND DRAWN BY RN  Final   Special Requests   Final    BOTTLES DRAWN AEROBIC AND ANAEROBIC Blood Culture adequate volume   Culture NO GROWTH 2 DAYS  Final   Report Status PENDING  Incomplete  Blood Culture (routine x 2)     Status: None (Preliminary result)   Collection Time: 04/29/17 12:27 PM  Result Value Ref Range Status   Specimen Description BLOOD RIGHT ARM  Final   Special Requests   Final    BOTTLES DRAWN AEROBIC AND ANAEROBIC Blood Culture adequate volume   Culture NO GROWTH 2 DAYS  Final   Report Status PENDING  Incomplete  MRSA PCR Screening     Status: None   Collection Time: 04/29/17  5:04 PM  Result Value Ref Range Status   MRSA by PCR NEGATIVE NEGATIVE Final    Comment:        The GeneXpert MRSA Assay (FDA approved for NASAL specimens only), is one component of a comprehensive MRSA colonization surveillance program. It is not intended to diagnose MRSA infection nor to guide or monitor treatment for MRSA infections.     Time coordinating discharge:   SIGNED:  Irwin Brakeman, MD  [facility] 05/01/2017, 10:09 AM Pager [phone]  If 7PM-7AM, please contact night-coverage [email or web address] Password [email or web address]

## 2017-05-02 DIAGNOSIS — R6520 Severe sepsis without septic shock: Secondary | ICD-10-CM

## 2017-05-02 DIAGNOSIS — A419 Sepsis, unspecified organism: Principal | ICD-10-CM

## 2017-05-02 NOTE — Progress Notes (Signed)
PROGRESS NOTE   Sandy Schmitt  BTD:[id number]  DOB: 1916/08/27  DOA: 05/01/2017 PCP: Jani Gravel, MD  Brief Admission Hx: Sandy Schmitt is a 83 y.o. female who is chronically debilitated, nothing by mouth with the PEG tube has been in place for several years, social services provides guardianship for patient presented from the [facility] SNF today when patient was found to be unconscious and unresponsive and healthcare providers could not get her blood pressure.  MDM/Assessment & Plan:   1. Severe sepsis and multiple electrolyte abnormalities - Pt has been transitioned to full comfort care and comfort care orders have been started.  Morphine ordered for breathlessness and respiratory distress. Lorazepam ordered for anxiety.  Pt does appear comfortable and not in distress.    Code Status: DNR Disposition Plan:    Consultants:  Palliative medicine  Subjective: Pt appears terminal.  Does not appear to be in distress.   Objective: Vitals:   05/01/17 1444 05/01/17 1516 05/01/17 2205 05/02/17 0451  BP: (!) 87/27  (!) 76/32 (!) 74/43  Pulse: 93  83 74  Resp: (!) 35 (!) 35 (!) 37 (!) 30  Temp: (!) 97.5 F (36.4 C)  97.6 F (36.4 C) (!) 97.4 F (36.3 C)  TempSrc: Oral  Axillary Axillary  SpO2: 100%  97% 99%    Intake/Output Summary (Last 24 hours) at 05/02/17 1062 Last data filed at 05/01/17 1840  Gross per 24 hour  Intake                0 ml  Output              400 ml  Net             -400 ml   There were no vitals filed for this visit.   REVIEW OF SYSTEMS  As per history otherwise all reviewed and reported negative  Exam:  General exam: patient appears terminal.  Respiratory system: coarse upper airway congestion noise.  Cardiovascular system: S1 & S2 heard.  Gastrointestinal system: Abdomen is soft.  Central nervous system: unresponsive Extremities: no cyanosis.   Data Reviewed: Basic Metabolic Panel:  Recent Labs Lab 04/29/17 1258  NA 158*  K <2.0*  CL  >130*  CO2 8*  GLUCOSE 297*  BUN 120*  CREATININE 2.18*  CALCIUM <4.0*   Liver Function Tests:  Recent Labs Lab 04/29/17 1258  AST 17  ALT 15  ALKPHOS 72  BILITOT 0.5  PROT 6.3*  ALBUMIN 2.2*   No results for input(s): LIPASE, AMYLASE in the last 168 hours. No results for input(s): AMMONIA in the last 168 hours. CBC:  Recent Labs Lab 04/29/17 1258  WBC 9.0  NEUTROABS 7.9*  HGB 10.8*  HCT 40.9  MCV 107.1*  PLT 128*   Cardiac Enzymes:  Recent Labs Lab 04/29/17 1258  TROPONINI 0.05*   CBG (last 3)  No results for input(s): GLUCAP in the last 72 hours. Recent Results (from the past 240 hour(s))  Blood Culture (routine x 2)     Status: None (Preliminary result)   Collection Time: 04/29/17 11:46 AM  Result Value Ref Range Status   Specimen Description BLOOD LEFT HAND DRAWN BY RN  Final   Special Requests   Final    BOTTLES DRAWN AEROBIC AND ANAEROBIC Blood Culture adequate volume   Culture NO GROWTH 2 DAYS  Final   Report Status PENDING  Incomplete  Blood Culture (routine x 2)     Status: None (Preliminary result)  Collection Time: 04/29/17 12:27 PM  Result Value Ref Range Status   Specimen Description BLOOD RIGHT ARM  Final   Special Requests   Final    BOTTLES DRAWN AEROBIC AND ANAEROBIC Blood Culture adequate volume   Culture NO GROWTH 2 DAYS  Final   Report Status PENDING  Incomplete  MRSA PCR Screening     Status: None   Collection Time: 04/29/17  5:04 PM  Result Value Ref Range Status   MRSA by PCR NEGATIVE NEGATIVE Final    Comment:        The GeneXpert MRSA Assay (FDA approved for NASAL specimens only), is one component of a comprehensive MRSA colonization surveillance program. It is not intended to diagnose MRSA infection nor to guide or monitor treatment for MRSA infections.      Studies: No results found.   Scheduled Meds: . atropine  2 drop Sublingual TID  . morphine CONCENTRATE  5 mg Sublingual Q4H  . sodium chloride flush  3  mL Intravenous Q12H   Continuous Infusions: . sodium chloride      Active Problems:   Severe sepsis ([place])  Time spent:   Irwin Brakeman, MD, FAAFP [facility] Pager [phone]  If 7PM-7AM, please contact night-coverage [email or web address] Password [email or web address] 05/02/2017, 9:21 AM    LOS: 1 day

## 2017-05-02 NOTE — Progress Notes (Signed)
I spoke with Marcene Brawn from [place]  She states that the patient has a pacemaker and they never turn them to off.

## 2017-05-04 LAB — CULTURE, BLOOD (ROUTINE X 2)
Culture: NO GROWTH
Culture: NO GROWTH
Special Requests: ADEQUATE
Special Requests: ADEQUATE

## 2017-05-21 NOTE — Progress Notes (Signed)
PROGRESS NOTE   Sandy Schmitt  NWG:[id number]  DOB: Apr 08, 1917  DOA: 05/01/2017 PCP: Jani Gravel, MD  Brief Admission Hx: Sandy Schmitt is a 83 y.o. female who is chronically debilitated, nothing by mouth with the PEG tube has been in place for several years, social services provides guardianship for patient presented from the [facility] SN[facility] today when patient was found to be unconscious and unresponsive and healthcare providers could not get her blood pressure.  MDM/Assessment & Plan:   1. Severe sepsis and multiple electrolyte abnormalities - Pt has been transitioned to full comfort care and comfort care orders have been started.  Continue the morphine ordered for breathlessness and respiratory distress.  A few doses weren't given on the nightshift.  Lorazepam ordered for anxiety.  She is having cheyne stokes respirations.  Pt does appear comfortable and not in distress. RN called pacemaker company and was told there is no need to turn off see note.   Code Status: DNR  Consultants:  Palliative medicine  Subjective: Pt appears terminal.  Does not appear to be in distress.   Objective: Vitals:   05/02/17 0451 05/02/17 1503 05/02/17 2151 2017/05/06 0423  BP: (!) 74/43 (!) 68/24 (!) 79/17 (!) 61/23  Pulse: 74 64 60 78  Resp: (!) 30 (!) 29 (!) 26 (!) 31  Temp: (!) 97.4 F (36.3 C)     TempSrc: Axillary     SpO2: 99% 98% 98%     Intake/Output Summary (Last 24 hours) at May 06, 2017 1113 Last data filed at 05/02/17 2154  Gross per 24 hour  Intake                3 ml  Output                0 ml  Net                3 ml   There were no vitals filed for this visit.   REVIEW OF SYSTEMS  As per history otherwise all reviewed and reported negative  Exam:  General exam: patient appears terminal.  Respiratory system: coarse upper airway congestion noise.  Cardiovascular system: S1 & S2 heard.  Gastrointestinal system: Abdomen is soft.  Central nervous system:  unresponsive Extremities: no cyanosis.   Data Reviewed: Basic Metabolic Panel:  Recent Labs Lab 04/29/17 1258  NA 158*  K <2.0*  CL >130*  CO2 8*  GLUCOSE 297*  BUN 120*  CREATININE 2.18*  CALCIUM <4.0*   Liver Function Tests:  Recent Labs Lab 04/29/17 1258  AST 17  ALT 15  ALKPHOS 72  BILITOT 0.5  PROT 6.3*  ALBUMIN 2.2*   No results for input(s): LIPASE, AMYLASE in the last 168 hours. No results for input(s): AMMONIA in the last 168 hours. CBC:  Recent Labs Lab 04/29/17 1258  WBC 9.0  NEUTROABS 7.9*  HGB 10.8*  HCT 40.9  MCV 107.1*  PLT 128*   Cardiac Enzymes:  Recent Labs Lab 04/29/17 1258  TROPONINI 0.05*   CBG (last 3)  No results for input(s): GLUCAP in the last 72 hours. Recent Results (from the past 240 hour(s))  Blood Culture (routine x 2)     Status: None (Preliminary result)   Collection Time: 04/29/17 11:46 AM  Result Value Ref Range Status   Specimen Description BLOOD LEFT HAND DRAWN BY RN  Final   Special Requests   Final    BOTTLES DRAWN AEROBIC AND ANAEROBIC Blood Culture adequate volume  Culture NO GROWTH 3 DAYS  Final   Report Status PENDING  Incomplete  Blood Culture (routine x 2)     Status: None (Preliminary result)   Collection Time: 04/29/17 12:27 PM  Result Value Ref Range Status   Specimen Description BLOOD RIGHT ARM  Final   Special Requests   Final    BOTTLES DRAWN AEROBIC AND ANAEROBIC Blood Culture adequate volume   Culture NO GROWTH 3 DAYS  Final   Report Status PENDING  Incomplete  MRSA PCR Screening     Status: None   Collection Time: 04/29/17  5:04 PM  Result Value Ref Range Status   MRSA by PCR NEGATIVE NEGATIVE Final    Comment:        The GeneXpert MRSA Assay (FDA approved for NASAL specimens only), is one component of a comprehensive MRSA colonization surveillance program. It is not intended to diagnose MRSA infection nor to guide or monitor treatment for MRSA infections.      Studies: No  results found.   Scheduled Meds: . atropine  2 drop Sublingual TID  . morphine CONCENTRATE  5 mg Sublingual Q4H  . sodium chloride flush  3 mL Intravenous Q12H   Continuous Infusions: . sodium chloride      Active Problems:   Severe sepsis ([place])  Time spent:   Irwin Brakeman, MD, FAAFP [facility] Pager [phone]  If 7PM-7AM, please contact night-coverage [email or web address] Password [email or web address] 05-07-17, 11:13 AM    LOS: 2 days

## 2017-05-21 NOTE — Discharge Summary (Signed)
Expiration Note  SHAKEEMA LIPPMAN  MR#: [id number]  DOB:1917/05/01  Date of Admission: 05/19/2017 Date of Death: May 22, 2017  Attending [place]  Patient's PCP: Jani Gravel, MD  Consults:  palliative medicine  Cause of Death: Severe sepsis hypocalcemia Septic shock Metabolic acidosis Renal failure   Hospital Course:  [place] PAONE is a 83 y.o. female who is chronically debilitated, nothing by mouth with the PEG tube has been in place for several years, social services provides guardianship for patient presented from the [facility] today when patient was found to be unconscious and unresponsive and healthcare providers could not get her blood pressure. Patient was seen in the emergency department and found to be hypotensive and severely septic. The patient was unresponsive.  The patient was also noted to be febrile with a temperature of 103. ED course: Patient was resuscitated with IV fluid hydration and broad-spectrum antibiotics were obtained. Blood cultures were obtained. The patient was noted to have severe electrolyte abnormalities that weren't life-threatening including severe hypokalemia, hypocalcemia, severe sepsis with an elevated lactic acid greater than 3.9, severe dehydration and hypotension, tachypnea and noted to be critically ill.  The ED was able to confirm the DNR order and was able to speak with the patient's guardian.  They were in agreement with IVFs and IV antibiotics but did not desire central line or pressors.  Palliative medicine was consulted and full comfort care measures were taken. Pt was discharged to inpatient hospice care and comfort care measures instituted.    Signed: Conn Trombetta 22-May-2017, 6:50 AM

## 2017-05-21 DEATH — deceased

## 2017-05-22 ENCOUNTER — Encounter: Payer: Medicare Other | Admitting: Internal Medicine

## 2017-06-18 ENCOUNTER — Encounter: Payer: Medicare Other | Admitting: Internal Medicine
